# Patient Record
Sex: Male | Born: 1970 | Race: White | Hispanic: No | Marital: Married | Smoking: Former smoker
Health system: Southern US, Community
[De-identification: ages and names within clinical notes are randomized; demographics above are authoritative.]

## PROBLEM LIST (undated history)

## (undated) DIAGNOSIS — F32A Depression, unspecified: Secondary | ICD-10-CM

## (undated) DIAGNOSIS — I10 Essential (primary) hypertension: Secondary | ICD-10-CM

## (undated) DIAGNOSIS — I2510 Atherosclerotic heart disease of native coronary artery without angina pectoris: Secondary | ICD-10-CM

## (undated) DIAGNOSIS — F329 Major depressive disorder, single episode, unspecified: Secondary | ICD-10-CM

## (undated) DIAGNOSIS — F419 Anxiety disorder, unspecified: Secondary | ICD-10-CM

## (undated) DIAGNOSIS — E785 Hyperlipidemia, unspecified: Secondary | ICD-10-CM

## (undated) HISTORY — DX: Essential (primary) hypertension: I10

## (undated) HISTORY — DX: Depression, unspecified: F32.A

## (undated) HISTORY — DX: Major depressive disorder, single episode, unspecified: F32.9

## (undated) HISTORY — DX: Hyperlipidemia, unspecified: E78.5

## (undated) HISTORY — PX: TYMPANOSTOMY TUBE PLACEMENT: SHX32

## (undated) HISTORY — DX: Anxiety disorder, unspecified: F41.9

---

## 2002-07-03 ENCOUNTER — Ambulatory Visit (HOSPITAL_BASED_OUTPATIENT_CLINIC_OR_DEPARTMENT_OTHER): Admission: RE | Admit: 2002-07-03 | Discharge: 2002-07-03 | Payer: Self-pay | Admitting: Otolaryngology

## 2003-02-16 ENCOUNTER — Encounter: Payer: Self-pay | Admitting: Otolaryngology

## 2003-02-17 ENCOUNTER — Ambulatory Visit (HOSPITAL_COMMUNITY): Admission: RE | Admit: 2003-02-17 | Discharge: 2003-02-18 | Payer: Self-pay | Admitting: Otolaryngology

## 2004-05-26 HISTORY — PX: NASAL SEPTUM SURGERY: SHX37

## 2006-03-11 ENCOUNTER — Inpatient Hospital Stay (HOSPITAL_COMMUNITY): Admission: EM | Admit: 2006-03-11 | Discharge: 2006-03-14 | Payer: Self-pay | Admitting: Emergency Medicine

## 2006-03-12 ENCOUNTER — Ambulatory Visit: Payer: Self-pay | Admitting: Internal Medicine

## 2011-01-28 ENCOUNTER — Encounter (INDEPENDENT_AMBULATORY_CARE_PROVIDER_SITE_OTHER): Payer: Self-pay | Admitting: General Surgery

## 2011-01-28 ENCOUNTER — Ambulatory Visit (INDEPENDENT_AMBULATORY_CARE_PROVIDER_SITE_OTHER): Payer: BC Managed Care – PPO | Admitting: General Surgery

## 2011-01-28 VITALS — BP 146/104 | HR 88 | Temp 97.8°F | Ht 71.0 in | Wt 264.8 lb

## 2011-01-28 DIAGNOSIS — R220 Localized swelling, mass and lump, head: Secondary | ICD-10-CM

## 2011-01-28 DIAGNOSIS — R221 Localized swelling, mass and lump, neck: Secondary | ICD-10-CM

## 2011-01-28 NOTE — Progress Notes (Signed)
Subjective:     Patient ID: Jeffery Chapman, male   DOB: 28-Feb-1971, 40 y.o.   MRN: [id number]  HPI The patient is a 40 year old white male who has been having headaches for the last year. During that time he has noticed a knot on his posterior left neck at the base of the skull that seems to enlarge and then shrink it times. A lot of his head pain seems to be centered around this knot. It does not drain. He denies any fevers or chills  Review of Systems     Objective:   Physical Exam On exam he has a firm mobile palpable nodule in the left posterior upper neck. There is no overlying punctate hole to suggest a sebaceous cyst. It feels too firm to be a lipoma. With its change of size it is most likely a lymph node but the presentation seems a bit strange.   Assessment:     Mass in the upper left posterior neck    Plan:     Because the presentation seems a bit strange and the MRI did not show any abnormality in this area I would prefer to refer him to a ENT doctor. He has seen Dr. Annalee Genta in the past so we will refer him there.

## 2011-01-28 NOTE — Patient Instructions (Signed)
Refer to Dr. Annalee Genta in ENT

## 2011-09-14 ENCOUNTER — Ambulatory Visit (INDEPENDENT_AMBULATORY_CARE_PROVIDER_SITE_OTHER): Payer: BC Managed Care – PPO | Admitting: Family Medicine

## 2011-09-14 VITALS — BP 132/79 | HR 91 | Temp 98.8°F | Resp 16 | Ht 69.5 in | Wt 283.0 lb

## 2011-09-14 DIAGNOSIS — I10 Essential (primary) hypertension: Secondary | ICD-10-CM

## 2011-09-14 DIAGNOSIS — R51 Headache: Secondary | ICD-10-CM

## 2011-09-14 DIAGNOSIS — H9313 Tinnitus, bilateral: Secondary | ICD-10-CM

## 2011-09-14 DIAGNOSIS — H9319 Tinnitus, unspecified ear: Secondary | ICD-10-CM

## 2011-09-14 DIAGNOSIS — G8929 Other chronic pain: Secondary | ICD-10-CM

## 2011-09-14 MED ORDER — FLUTICASONE PROPIONATE 50 MCG/ACT NA SUSP: 2.0000 | Freq: Every day | NASAL | Status: DC

## 2011-09-14 MED ORDER — TIZANIDINE HCL 4 MG PO TABS: 4.0000 mg | ORAL_TABLET | Freq: Every day | ORAL | Status: DC

## 2011-09-14 MED ORDER — TOPIRAMATE 200 MG PO TABS: 200.0000 mg | ORAL_TABLET | Freq: Every day | ORAL | Status: DC

## 2011-09-14 MED ORDER — ALPRAZOLAM 0.5 MG PO TABS: 0.5000 mg | ORAL_TABLET | Freq: Every evening | ORAL | Status: DC | PRN

## 2011-09-14 MED ORDER — AMOXICILLIN 875 MG PO TABS: 875.0000 mg | ORAL_TABLET | Freq: Two times a day (BID) | ORAL | Status: DC

## 2011-09-14 MED ORDER — LISINOPRIL 5 MG PO TABS: 5.0000 mg | ORAL_TABLET | Freq: Every day | ORAL | Status: DC

## 2011-09-14 NOTE — Patient Instructions (Signed)
Tinnitus Sounds you hear in your ears and coming from within the ear is called tinnitus. This can be a symptom of many ear disorders. It is often associated with hearing loss.  Tinnitus can be seen with:  Infections.   Ear blockages such as wax buildup.   Meniere's disease.   Ear damage.   Inherited.   Occupational causes.  While irritating, it is not usually a threat to health. When the cause of the tinnitus is wax, infection in the middle ear, or foreign body it is easily treated. Hearing loss will usually be reversible.  TREATMENT  When treating the underlying cause does not get rid of tinnitus, it may be necessary to get rid of the unwanted sound by covering it up with more pleasant background noises. This may include music, the radio etc. There are tinnitus maskers which can be worn which produce background noise to cover up the tinnitus. Avoid all medications which tend to make tinnitus worse such as alcohol, caffeine, aspirin, and nicotine. There are many soothing background tapes such as rain, ocean, thunderstorms, etc. These soothing sounds help with sleeping or resting. Keep all follow-up appointments and referrals. This is important to identify the cause of the problem. It also helps avoid complications, impaired hearing, disability, or chronic pain. Document Released: 05/12/2005 Document Revised: 05/01/2011 Document Reviewed: 12/29/2007 [employer] Patient Information 2012 [employer]. 

## 2011-09-14 NOTE — Progress Notes (Signed)
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 It seemed to clear up on Tuesday but then came back to see night and since Wednesday he said high pitched ringing in both ears. he's had no ear pain or dizziness. 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  Patient has had bilateral myringotomy tubes in the past. In addition he's had chronic occipital headaches for which she seen neurologists, ureters and throat doctors, and most recently Dr. Renae Fickle who injected the occipital area in the past with good relief. Patient has been out of his medicines for blood pressure and headaches for a long time. He's not taken any Indocin or aspirin products either.  Objective alert and in no acute distress. Patient walks with an antalgic gait (he said he pulled a muscle in his right leg yesterday playing soccer with his kids)  HEENT: Bilateral myringotomy scars with mild retraction of TMs on both sides. His hearing is grossly normal.  Oropharynx clear  Neck: Supple no adenopathy  Neuro exam: Normal EOM and cranial nerve function, grossly normal motor function of both arms and legs.  Fundi: Normal  Chest: Clear  Heart: Regular no murmur or gallop  neck exam: No bruit  Assessment acute high-frequency tinnitus that is most consistent with serous otitis media. It is concerning that he has had chronic headaches in the past but as he reports the CT was negative and there are no other worrisome symptoms at this time.  Plan: Amoxicillin and Flonase,  I refilled his other medications and advised not to take aspirin since he's had problems with ever dependency in the past.  Past patient call me in 48 hours if symptoms are not clearing so that we can make a referral to Dr. Annalee Genta

## 2012-01-09 ENCOUNTER — Ambulatory Visit (INDEPENDENT_AMBULATORY_CARE_PROVIDER_SITE_OTHER): Payer: BC Managed Care – PPO | Admitting: Internal Medicine

## 2012-01-09 VITALS — BP 118/72 | HR 68 | Temp 98.1°F | Resp 14 | Ht 69.75 in | Wt 280.8 lb

## 2012-01-09 DIAGNOSIS — I2510 Atherosclerotic heart disease of native coronary artery without angina pectoris: Secondary | ICD-10-CM | POA: Insufficient documentation

## 2012-01-09 DIAGNOSIS — Z7189 Other specified counseling: Secondary | ICD-10-CM

## 2012-01-09 DIAGNOSIS — M109 Gout, unspecified: Secondary | ICD-10-CM | POA: Insufficient documentation

## 2012-01-09 NOTE — Progress Notes (Signed)
  Subjective:    Patient ID: Jeffery Chapman, male    DOB: 10/21/1970, 41 y.o.   MRN: [id number]  HPI Having gout attack left foot, usual sxs and place. Recent dx of CAD and had stent placed while in  [place]. Going to [facility] cardiology next week See scanned heart cath and stent done in [place]  Review of Systems obesity    Objective:   Physical Exam  Vitals reviewed. Constitutional: He is oriented to person, place, and time. He appears well-nourished. No distress.  Cardiovascular: Normal rate and regular rhythm.   Pulmonary/Chest: Effort normal.  Musculoskeletal: Normal range of motion. He exhibits tenderness.  Neurological: He is alert and oriented to person, place, and time.  Skin: There is erythema.  Psychiatric: He has a normal mood and affect.   Left foot warm, red, swollen a little.       Assessment & Plan:  Do not use nsaids daily Indocin ok for gout attacks 2-3 x per yr. Ok to drive

## 2012-01-14 ENCOUNTER — Encounter: Payer: Self-pay | Admitting: Cardiovascular Disease

## 2012-01-14 ENCOUNTER — Ambulatory Visit (INDEPENDENT_AMBULATORY_CARE_PROVIDER_SITE_OTHER): Payer: Self-pay | Admitting: Cardiovascular Disease

## 2012-01-14 VITALS — BP 115/74 | HR 67 | Ht 70.0 in | Wt 284.1 lb

## 2012-01-14 DIAGNOSIS — M109 Gout, unspecified: Secondary | ICD-10-CM

## 2012-01-14 DIAGNOSIS — E782 Mixed hyperlipidemia: Secondary | ICD-10-CM

## 2012-01-14 DIAGNOSIS — I2510 Atherosclerotic heart disease of native coronary artery without angina pectoris: Secondary | ICD-10-CM

## 2012-01-14 MED ORDER — ATORVASTATIN CALCIUM 80 MG PO TABS: 80.0000 mg | ORAL_TABLET | Freq: Every day | ORAL | Status: DC

## 2012-01-14 MED ORDER — BISOPROLOL FUMARATE 5 MG PO TABS: ORAL_TABLET | ORAL | Status: DC

## 2012-01-14 MED ORDER — NITROGLYCERIN 0.4 MG/SPRAY TL SOLN: 1.0000 | Status: DC | PRN

## 2012-01-14 MED ORDER — CLOPIDOGREL BISULFATE 75 MG PO TABS: 75.0000 mg | ORAL_TABLET | Freq: Every day | ORAL | Status: DC

## 2012-01-14 NOTE — Assessment & Plan Note (Signed)
Continue high dose statin  Labs in 8 weeks

## 2012-01-14 NOTE — Assessment & Plan Note (Signed)
Recent stent to circumflex.  Dual antiplatelet Rx  Check P2Y  ASA

## 2012-01-14 NOTE — Assessment & Plan Note (Signed)
Try to avoid NSAI's Check uric acid with labs for cholesterol in 8 weeks.  Colchicine for acute attacks  Usually involves right great toe

## 2012-01-14 NOTE — Patient Instructions (Addendum)
Your physician recommends that you schedule a follow-up appointment in: 8 WEEKS WITH  DR [facility] Your physician recommends that you continue on your current medications as directed. Please refer to the Current Medication list given to you today. Your physician recommends that you return for lab work in:  P2Y DX V58.69

## 2012-01-14 NOTE — Progress Notes (Signed)
Patient ID: Jeffery Chapman, male   DOB: 1971-02-11, 41 y.o.   MRN: [id number] 41 yo Rx for circumflex MI in [place] 01/02/12  :Presented with chest pain and Troponin 9.  No acute ST elevation.  Had biodegradable stent to totally occluded OM  Single vessel disease No mention of EF by records.  CRF;s HTN and elevated lipids  Anginal pain appears to be left arm and jaw.  Has had chronic central chest pain for years thought due to anxiety on xanax for years.  Since coming home following low carb diet and walking on treadmill at [facility].  No pains.  Taking meds but needs scripts  ROS: Denies fever, malais, weight loss, blurry vision, decreased visual acuity, cough, sputum, SOB, hemoptysis, pleuritic pain, palpitaitons, heartburn, abdominal pain, melena, lower extremity edema, claudication, or rash.  All other systems reviewed and negative   General: Affect appropriate Healthy:  appears stated age HEENT: normal Neck supple with no adenopathy JVP normal no bruits no thyromegaly Lungs clear with no wheezing and good diaphragmatic motion Heart:  S1/S2 no murmur,rub, gallop or click PMI normal Abdomen: benighn, BS positve, no tenderness, no AAA no bruit.  No HSM or HJR Distal pulses intact with no bruits No edema Neuro non-focal Skin warm and dry No muscular weakness  Medications Current Outpatient Prescriptions  Medication Sig Dispense Refill  . ALPRAZolam (XANAX) 0.5 MG tablet Take 1 tablet (0.5 mg total) by mouth at bedtime as needed.  90 tablet  2  . fluticasone (FLONASE) 50 MCG/ACT nasal spray Place 2 sprays into the nose daily.  1 g  0  . indomethacin (INDOCIN) 50 MG capsule Ad lib.      Marland Kitchen lisinopril (PRINIVIL,ZESTRIL) 5 MG tablet Take 1 tablet (5 mg total) by mouth daily.  90 tablet  3  . tiZANidine (ZANAFLEX) 4 MG tablet Take 1 tablet (4 mg total) by mouth daily.  90 tablet  3  . topiramate (TOPAMAX) 200 MG tablet Take 1 tablet (200 mg total) by mouth daily.  90 tablet  2     Allergies Hctz  Family History: Family History  Problem Relation Age of Onset  . Alcohol abuse Father   . Cancer Paternal Aunt     breast  . Cancer Paternal Grandmother     breast    Social History: History   Social History  . Marital Status: Married    Spouse Name: N/A    Number of Children: N/A  . Years of Education: N/A   Occupational History  . Not on file.   Social History Main Topics  . Smoking status: Former Games developer  . Smokeless tobacco: Not on file   Comment: quit- 2000  . Alcohol Use: Yes  . Drug Use: No  . Sexually Active:    Other Topics Concern  . Not on file   Social History Narrative  . No narrative on file    Electrocardiogram:  NSR rate 61  T wave inversion lead 3 nonspecific ST/T wave change  Assessment and Plan

## 2012-01-15 ENCOUNTER — Ambulatory Visit (HOSPITAL_COMMUNITY)
Admission: RE | Admit: 2012-01-15 | Discharge: 2012-01-15 | Disposition: A | Discharge status: Home / Self Care | Payer: BC Managed Care – PPO | Source: Ambulatory Visit | Attending: Cardiovascular Disease | Admitting: Cardiovascular Disease

## 2012-01-15 DIAGNOSIS — Z79899 Other long term (current) drug therapy: Secondary | ICD-10-CM | POA: Insufficient documentation

## 2012-01-19 ENCOUNTER — Encounter (HOSPITAL_COMMUNITY): Payer: Self-pay | Admitting: Emergency Medicine

## 2012-01-19 ENCOUNTER — Other Ambulatory Visit: Payer: Self-pay | Admitting: *Deleted

## 2012-01-19 ENCOUNTER — Ambulatory Visit (INDEPENDENT_AMBULATORY_CARE_PROVIDER_SITE_OTHER): Payer: BC Managed Care – PPO | Admitting: Emergency Medicine

## 2012-01-19 ENCOUNTER — Emergency Department (HOSPITAL_COMMUNITY): Payer: BC Managed Care – PPO

## 2012-01-19 ENCOUNTER — Observation Stay (HOSPITAL_COMMUNITY)
Admission: EM | Admit: 2012-01-19 | Discharge: 2012-01-20 | DRG: 143 | Disposition: A | Discharge status: Home / Self Care | Payer: BC Managed Care – PPO | Attending: Cardiology | Admitting: Cardiology

## 2012-01-19 VITALS — BP 115/72 | HR 75 | Temp 98.0°F | Resp 18 | Ht 69.75 in | Wt 285.0 lb

## 2012-01-19 DIAGNOSIS — F329 Major depressive disorder, single episode, unspecified: Secondary | ICD-10-CM | POA: Diagnosis present

## 2012-01-19 DIAGNOSIS — E669 Obesity, unspecified: Secondary | ICD-10-CM

## 2012-01-19 DIAGNOSIS — Z79899 Other long term (current) drug therapy: Secondary | ICD-10-CM

## 2012-01-19 DIAGNOSIS — F419 Anxiety disorder, unspecified: Secondary | ICD-10-CM

## 2012-01-19 DIAGNOSIS — I2510 Atherosclerotic heart disease of native coronary artery without angina pectoris: Secondary | ICD-10-CM | POA: Diagnosis present

## 2012-01-19 DIAGNOSIS — F411 Generalized anxiety disorder: Secondary | ICD-10-CM | POA: Diagnosis present

## 2012-01-19 DIAGNOSIS — I200 Unstable angina: Secondary | ICD-10-CM

## 2012-01-19 DIAGNOSIS — R599 Enlarged lymph nodes, unspecified: Secondary | ICD-10-CM | POA: Diagnosis present

## 2012-01-19 DIAGNOSIS — R079 Chest pain, unspecified: Secondary | ICD-10-CM

## 2012-01-19 DIAGNOSIS — M109 Gout, unspecified: Secondary | ICD-10-CM | POA: Diagnosis present

## 2012-01-19 DIAGNOSIS — E782 Mixed hyperlipidemia: Secondary | ICD-10-CM | POA: Diagnosis present

## 2012-01-19 DIAGNOSIS — I10 Essential (primary) hypertension: Secondary | ICD-10-CM | POA: Diagnosis present

## 2012-01-19 DIAGNOSIS — Z87891 Personal history of nicotine dependence: Secondary | ICD-10-CM

## 2012-01-19 DIAGNOSIS — I498 Other specified cardiac arrhythmias: Secondary | ICD-10-CM | POA: Diagnosis present

## 2012-01-19 DIAGNOSIS — I252 Old myocardial infarction: Secondary | ICD-10-CM

## 2012-01-19 DIAGNOSIS — R0789 Other chest pain: Principal | ICD-10-CM | POA: Diagnosis present

## 2012-01-19 DIAGNOSIS — Z9861 Coronary angioplasty status: Secondary | ICD-10-CM

## 2012-01-19 DIAGNOSIS — F3289 Other specified depressive episodes: Secondary | ICD-10-CM | POA: Diagnosis present

## 2012-01-19 DIAGNOSIS — Z7982 Long term (current) use of aspirin: Secondary | ICD-10-CM

## 2012-01-19 HISTORY — DX: Atherosclerotic heart disease of native coronary artery without angina pectoris: I25.10

## 2012-01-19 LAB — CREATININE, SERUM
Creatinine, Ser: 0.98 mg/dL (ref 0.50–1.35)
GFR calc non Af Amer: >90 mL/min (ref >90)

## 2012-01-19 LAB — CBC WITH DIFFERENTIAL/PLATELET
Basophils Absolute: 0 10*3/uL (ref 0.0–0.1)
Eosinophils Absolute: 0.2 10*3/uL (ref 0.0–0.7)
Eosinophils Relative: 3 % (ref 0–5)
HCT: 35.4 % — ABNORMAL LOW (ref 39.0–52.0)
Lymphocytes Relative: 33 % (ref 12–46)
MCH: 30.2 pg (ref 26.0–34.0)
MCHC: 34.2 g/dL (ref 30.0–36.0)
MCV: 88.3 fL (ref 78.0–100.0)
Monocytes Absolute: 0.6 10*3/uL (ref 0.1–1.0)
RDW: 13.3 % (ref 11.5–15.5)
WBC: 5.9 10*3/uL (ref 4.0–10.5)

## 2012-01-19 LAB — CARDIAC PANEL(CRET KIN+CKTOT+MB+TROPI)
CK, MB: 1.9 ng/mL (ref 0.3–4.0)
Relative Index: 1.5 (ref 0.0–2.5)
Total CK: 128 U/L (ref 7–232)

## 2012-01-19 LAB — BASIC METABOLIC PANEL
CO2: 22 mEq/L (ref 19–32)
Calcium: 9.6 mg/dL (ref 8.4–10.5)
Creatinine, Ser: 0.93 mg/dL (ref 0.50–1.35)
GFR calc non Af Amer: >90 mL/min (ref >90)

## 2012-01-19 LAB — CBC
HCT: 36.8 % — ABNORMAL LOW (ref 39.0–52.0)
MCHC: 34 g/dL (ref 30.0–36.0)
RDW: 13.3 % (ref 11.5–15.5)

## 2012-01-19 LAB — TROPONIN I: Troponin I: <0.3 ng/mL (ref <0.30)

## 2012-01-19 MED ORDER — ATORVASTATIN CALCIUM 80 MG PO TABS
80.0000 mg | ORAL_TABLET | Freq: Every evening | ORAL | Status: DC
  Administered 2012-01-19: 80 mg via ORAL
  Filled 2012-01-19 (×2): qty 1

## 2012-01-19 MED ORDER — ACETAMINOPHEN 325 MG PO TABS
650.0000 mg | ORAL_TABLET | Freq: Once | ORAL | Status: AC
  Administered 2012-01-19: 650 mg via ORAL
  Filled 2012-01-19: qty 2

## 2012-01-19 MED ORDER — ONDANSETRON HCL 4 MG/2ML IJ SOLN: 4.0000 mg | Freq: Once | INTRAMUSCULAR | Status: DC

## 2012-01-19 MED ORDER — ALPRAZOLAM 0.5 MG PO TABS: 0.5000 mg | ORAL_TABLET | Freq: Every evening | ORAL | Status: DC | PRN

## 2012-01-19 MED ORDER — PRASUGREL HCL 10 MG PO TABS
10.0000 mg | ORAL_TABLET | Freq: Every day | ORAL | Status: DC
  Administered 2012-01-20: 10 mg via ORAL
  Filled 2012-01-19: qty 1

## 2012-01-19 MED ORDER — ZOLPIDEM TARTRATE 5 MG PO TABS: 5.0000 mg | ORAL_TABLET | Freq: Every evening | ORAL | Status: DC | PRN

## 2012-01-19 MED ORDER — ENOXAPARIN SODIUM 40 MG/0.4ML ~~LOC~~ SOLN
40.0000 mg | SUBCUTANEOUS | Status: DC
  Administered 2012-01-19: 40 mg via SUBCUTANEOUS
  Filled 2012-01-19 (×2): qty 0.4

## 2012-01-19 MED ORDER — ACETAMINOPHEN 325 MG PO TABS: 650.0000 mg | ORAL_TABLET | ORAL | Status: DC | PRN

## 2012-01-19 MED ORDER — ASPIRIN 81 MG PO CHEW
81.0000 mg | CHEWABLE_TABLET | Freq: Every day | ORAL | Status: DC
  Administered 2012-01-20: 81 mg via ORAL
  Filled 2012-01-19: qty 1

## 2012-01-19 MED ORDER — MORPHINE SULFATE 4 MG/ML IJ SOLN: 4.0000 mg | Freq: Once | INTRAMUSCULAR | Status: DC

## 2012-01-19 MED ORDER — ONDANSETRON HCL 4 MG/2ML IJ SOLN: 4.0000 mg | Freq: Four times a day (QID) | INTRAMUSCULAR | Status: DC | PRN

## 2012-01-19 MED ORDER — NITROGLYCERIN 0.4 MG SL SUBL: 0.4000 mg | SUBLINGUAL_TABLET | SUBLINGUAL | Status: DC | PRN

## 2012-01-19 MED ORDER — PRASUGREL HCL 10 MG PO TABS: 10.0000 mg | ORAL_TABLET | Freq: Every day | ORAL | Status: DC

## 2012-01-19 MED ORDER — LISINOPRIL 5 MG PO TABS
5.0000 mg | ORAL_TABLET | Freq: Every evening | ORAL | Status: DC
  Administered 2012-01-19: 5 mg via ORAL
  Filled 2012-01-19 (×2): qty 1

## 2012-01-19 MED ORDER — BISOPROLOL FUMARATE 5 MG PO TABS
5.0000 mg | ORAL_TABLET | Freq: Every morning | ORAL | Status: DC
  Administered 2012-01-20: 5 mg via ORAL
  Filled 2012-01-19: qty 1

## 2012-01-19 NOTE — ED Notes (Addendum)
Pt returned from X-ray and placed back on monitors.

## 2012-01-19 NOTE — ED Notes (Signed)
MD at pt bedside

## 2012-01-19 NOTE — H&P (Signed)
History and Physical   Patient ID: Jeffery Chapman MRN: [id number], DOB/AGE: 09-22-1970   Admit date: 01/19/2012 Date of Consult: 01/19/2012   Primary Physician: Elvina Sidle, MD Primary Cardiologist: Charlton Haws, MD  HPI: Jeffery Chapman is a 41yo male with PMHx significant for CAD (NSTEMI s/p biodegradable stenting to LCx in [place] 01/02/12), HTN, HL, obesity, anxiety and gout who presents to [facility] ED with chest pain.  He had recently followed up with Dr. Eden Emms on 01/14/12 for medication refills. Prior records were reviewed. He was noted to have single vessel CAD. There was no mention of EF. Anginal pain noted to be primarily located in left arm and jaw. He endorsed chronic chest pain for years which he attributed to anxiety. He states since [place], he has been following a low carb diet and walking for exercise. A P2Y12 assay was ordered and returned at 197. Home meds as below include ASA, Effient, BB, ACEi, Lipitor 80, NTG SL PRN.   The patient reports baseline anxiety characterized as intermittent, bandlike chest discomfort occurring for about 8 years. He had undergone previous stress testing when this sensation first begun which was normal.   He states that he had been traveling extensively prior to undergoing PCI earlier this month. He states he does not typically get much sleep due to his anxiety. He had recently returned from a fishing trip in [place] and noticed a nagging left shoulder discomfort with radiation to the inside of his left arm and elbow. He also notes radiation to his jaw and associated diaphoresis. These episodes were intermittent and not associated with exertion. He would take NSAIDs, Xanax and warm showers with alleviation. He attributed this to a musculoskeletal cause. These episodes worsened while in [place]. He was worked up in an ED there and trop returned >9.0. He underwent biodegradable stenting to his LCx distal to the OM1. There was residual  nonobstructive LAD disease.   He had been doing well since that time. He has been adhering to a low carb diet and walking several miles a day on a treadmill. During activity, he will notice short (1-2 sec) episodes of sharp chest and arm pain similar in quality but less intense than just before his cath. On Saturday, he notes that he "just did not feel right." He describes this as paranoia, however has limited his activity level. He does note occasional intermittent 1-2 sec episodes of sharp chest and left pain pain at rest during this time. He denies shortness of breath, lightheadedness. N/v, diaphoresis, LE edema, orhopnea, PND or new cough. He denies fevers or chills. He denies active bleeding. He quit smoking tobacco 15 years ago. He quit chewing tobacco 3 weeks ago. He notes occasional alcohol use. Denies elicit drug use. These symptoms persisted and he presented to th ED.   Upon ED arrival, EKG reveals no evidence of ischemia. Initial trop-I WNL. CBC reveals a mild normocytic anemia (H/H at 12.1/35.4). BMET unremarkable. CXR reveals cardiomegaly without congestive failure.   Problem List: Past Medical History  Diagnosis Date  . Hyperlipidemia   . Hypertension   . Anxiety   . Depression   . Gout   . CAD (coronary artery disease)     12/2011- PCI in [place]; s/p nondegradable stent to LAD; nonobstructive residual LAD disease    Past Surgical History  Procedure Date  . Nasal septum surgery 2006  . Tympanostomy tube placement 1976, 1981     Allergies:  Allergies  Allergen  Reactions  . Hctz (Hydrochlorothiazide)     aggravates gout    Home Medications: Prior to Admission medications   Medication Sig Start Date End Date Taking? Authorizing Provider  ALPRAZolam Prudy Feeler) 0.5 MG tablet Take 0.5-0.75 mg by mouth at bedtime as needed. 09/14/11  Yes Elvina Sidle, MD  aspirin 81 MG chewable tablet Chew 81 mg by mouth daily.   Yes Historical Provider, MD  atorvastatin (LIPITOR) 80 MG  tablet Take 80 mg by mouth every evening. 01/14/12 01/13/13 Yes Wendall Stade, MD  bisoprolol (ZEBETA) 5 MG tablet Take 5 mg by mouth every morning.   Yes Historical Provider, MD  cholecalciferol (VITAMIN D) 1000 UNITS tablet Take 1,000 Units by mouth daily.   Yes Historical Provider, MD  Coenzyme Q10 (CO Q10) 100 MG TABS Take 100 mg by mouth daily.   Yes Historical Provider, MD  indomethacin (INDOCIN) 50 MG capsule Take 100 mg by mouth 3 (three) times daily as needed. For gout 11/12/10  Yes Historical Provider, MD  lisinopril (PRINIVIL,ZESTRIL) 5 MG tablet Take 5 mg by mouth every evening.   Yes Historical Provider, MD  Magnesium 500 MG CAPS Take 500 mg by mouth daily.   Yes Historical Provider, MD  nitroGLYCERIN (NITROLINGUAL) 0.4 MG/SPRAY spray Place 1 spray under the tongue every 5 (five) minutes as needed for chest pain. 01/14/12 01/13/13 Yes Wendall Stade, MD  Omega-3 Fatty Acids (FISH OIL PO) Take 1 capsule by mouth daily.   Yes Historical Provider, MD  prasugrel (EFFIENT) 10 MG TABS Take 1 tablet (10 mg total) by mouth daily. 01/19/12   Wendall Stade, MD    Inpatient Medications:     . acetaminophen  650 mg Oral Once  . DISCONTD:  morphine injection  4 mg Intravenous Once  . DISCONTD: ondansetron  4 mg Intravenous Once    (Not in a hospital admission)  Family History  Problem Relation Age of Onset  . Alcohol abuse Father   . Cancer Paternal Aunt     breast  . Cancer Paternal Grandmother     breast     History   Social History  . Marital Status: Married    Spouse Name: N/A    Number of Children: N/A  . Years of Education: N/A   Occupational History  . Contractor    Social History Main Topics  . Smoking status: Former Games developer  . Smokeless tobacco: Not on file   Comment: quit- 2000  . Alcohol Use: Yes  . Drug Use: No  . Sexually Active:    Other Topics Concern  . Not on file   Social History Narrative  . No narrative on file     Review of Systems: General:  positive for swollen lymph nodes, negative for chills, fever, night sweats or weight changes.  Cardiovascular: negative for chest pain, dyspnea on exertion, edema, orthopnea, palpitations, paroxysmal nocturnal dyspnea or shortness of breath Dermatological: negative for rash Respiratory: negative for cough or wheezing Urologic: negative for hematuria Abdominal: negative for nausea, vomiting, diarrhea, bright red blood per rectum, melena, or hematemesis Neurologic:  negative for visual changes, syncope, or dizziness Psych: positive for anxiety All other systems reviewed and are otherwise negative except as noted above.  Physical Exam: Blood pressure 132/64, pulse 59, temperature 97.8 F (36.6 C), temperature source Oral, resp. rate 18, SpO2 98.00%.    General: Obese, well developed, in no acute distress. Head: Normocephalic, atraumatic, sclera non-icteric, no xanthomas, nares are without discharge.  Neck: Negative  for carotid bruits. JVD not elevated. Mild tenderness and lymphadenopathy to R submental, submandibular LNs. Lungs: Clear bilaterally to auscultation without wheezes, rales, or rhonchi. Breathing is unlabored. Heart: RRR with S1 S2. No murmurs, rubs, or gallops appreciated. Abdomen: Soft, non-tender, non-distended with normoactive bowel sounds. No hepatomegaly. No rebound/guarding. No obvious abdominal masses. Msk:  Strength and tone appears normal for age. Extremities: No clubbing, cyanosis or edema.  Distal pedal pulses are 2+ and equal bilaterally. Neuro: Alert and oriented X 3. Moves all extremities spontaneously. Psych:  Responds to questions appropriately with a normal affect.  Labs: Recent Labs  Basename 01/19/12 1222   WBC 5.9   HGB 12.1*   HCT 35.4*   MCV 88.3   PLT 243   Lab 01/19/12 1222  NA 141  K 4.2  CL 108  CO2 22  BUN 16  CREATININE 0.93  CALCIUM 9.6  PROT --  BILITOT --  ALKPHOS --  ALT --  AST --  AMYLASE --  LIPASE --  GLUCOSE 92   Recent  Labs  Basename 01/19/12 1222   CKTOTAL --   CKMB --   CKMBINDEX --   TROPONINI <0.30   Radiology/Studies: Dg Chest 2 View  01/19/2012  *RADIOLOGY REPORT*  Clinical Data: Chest pain/pressure.  CHEST - 2 VIEW  Comparison: None.  Findings: Lateral view degraded by patient arm position.  Mild mid thoracic spondylosis. Midline trachea.  Moderate cardiomegaly. Mediastinal contours otherwise within normal limits. No pleural effusion or pneumothorax.  No congestive failure.  Clear lungs.  IMPRESSION: Cardiomegaly without congestive failure.   Original Report Authenticated By: Consuello Bossier, M.D.     EKG: NSR, 60 bpm, LAD, no ST-T wave changes  ASSESSMENT:   1. Atypical chest pain 2. CAD 3. HTN 4. Hyperlipidemia 5. History of tobacco abuse 6. Anxiety 7. Gout 8. Lymphadenopathy  DISCUSSION/PLAN:  The patient reports generally not feeling right. He denies experiencing worsening symptoms which appear to be his anginal equivalent- jaw and left arm pain. He does state that he has anxiety at baseline which is debilitating at times affecting his energy level. He does some increase to his anxiety after undergoing PCI earlier this month. He has made a considerable effort to mitigate his risk factors including dietary modification and daily aerobic exercise. He notes no exertional ischemic symptoms with the latter. EKG and initial trop-I reveal no evidence of ischemia. CXR reveals no acute process. VSS. His symptoms are fairly atypical for ACS, however given his cardiac history with recent intervention and cardiac risk factors, will admit to telemetry and rule out. Continue outpatient medications, particularly ASA/Effient/BB/ACEi/statin/NTG. Will continue home Xanax. Hold NSAID for gout. There is no evidence of acute gouty attack. Lymphadenopathy may be representative of a viral URI (afebrile without leukocytosis, no evidence of exudate on exam, no cough). Should he rule out, will discharge and have him  follow-up with Dr. Eden Emms for continued risk factor reduction and evaluation of the patient's symptoms. Anxiety may continue to cloud clear anginal symptoms, and have encouraged PCP follow-up for management of this.    Signed, R. Hurman Horn, PA-C 01/19/2012, 3:21 PM   As above, patient seen and examined. Briefly 41 year old male with past medical history of coronary disease, hypertension, hyperlipidemia, anxiety with chest pain. Patient is status post PCI of his left circumflex following a non-ST elevation myocardial infarction in [place] on 01/02/2012. At that time cardiac catheterization revealed no obstructive disease in his LAD or right coronary artery. The patient's presenting symptoms  at that time were pain in his left upper arm and shoulder. The patient has had intermittent bandlike pain in the substernal area for 8 years that is relieved with Xanax. It is not exertional and it is unlike his pain at the time of his infarct. Over the past several days he notices increased weakness and fatigue. He also had sharp pain in his left chest for one to 2 seconds while on his treadmill. He also has had some of the bandlike pain described above. He has had no pain like his infarct pain. He presented to the emergency room and is presently pain-free. Cardiology asked to evaluate. He denies dyspnea on exertion, orthopnea, PND, pedal edema or exertional chest pain. Electrocardiogram shows sinus rhythm with no ST changes. Initial enzymes negative. Chest pain is very atypical. Will rule out myocardial infarction with serial enzymes. Continue preadmission medications including aspirin, effient, beta blocker, ACE inhibitor and statin. If enzymes negative DC tomorrow morning and follow up with Dr. Eden Emms. Olga Millers  3:21 PM

## 2012-01-19 NOTE — Progress Notes (Signed)
Subjective:    Patient ID: Jeffery Chapman, male    DOB: 17-Mar-1971, 41 y.o.   MRN: [id number]  HPI Comments: Travelled to [place] for three weeks and while there had chest pain and was stented in the circumflex artery.  Over the weekend had two episodes of prolonged chest pain hours long while active. Forced to sit down and rest intermittently.  Diaphoretic and light headed with radiation into the left arm.  Presumed it was anxiety.  Today had another episode of chest pain that was associated with a treadmill exercise.  Again had pain in left arm and light headedness.  Took his only dose of NTG.  Still has a "warm sensation" in his substernal area.  Chest Pain  This is a new problem. The current episode started in the past 7 days. The onset quality is sudden. The problem occurs intermittently. The problem has been unchanged. The pain is present in the substernal region. The pain is moderate. The quality of the pain is described as heavy and pressure. The pain radiates to the left arm. Associated symptoms include diaphoresis, dizziness, exertional chest pressure, malaise/fatigue, palpitations and shortness of breath. Pertinent negatives include no abdominal pain, back pain, claudication, cough, fever, headaches, hemoptysis, irregular heartbeat, leg pain, lower extremity edema, nausea, near-syncope, numbness, orthopnea, PND, sputum production, syncope or vomiting. The pain is aggravated by exertion. He has tried nothing for the symptoms. Risk factors include lack of exercise, male gender, obesity and smoking/tobacco exposure.  His past medical history is significant for anxiety/panic attacks, hyperlipidemia and hypertension.  Pertinent negatives for past medical history include no aneurysm, no aortic aneurysm, no aortic dissection, no arrhythmia, no bicuspid aortic valve, no CAD, no cancer, no congenital heart disease, no connective tissue disease, no COPD, no CHF, no diabetes, no DVT, no hyperhomocysteinemia,  no Kawasaki disease, no Marfan's syndrome, no MI, no mitral valve prolapse, no pacemaker, no PE, no PVD, no recent injury, no rheumatic fever, no seizures, no sickle cell disease, no sleep apnea, no spontaneous pneumothorax, no stimulant use, no strokes, no thyroid problem, no TIA, Turner syndrome and no valve disorder.  His family medical history is significant for heart disease in family, hyperlipidemia in family and hypertension in family.  Pertinent negatives for family medical history include: family history of aortic dissection, no CAD in family, no connective tissue disease in family, no early MI in family, no PE in family, no PVD in family, no sickle cell disease in family, no stroke in family, no sudden death in family and no TIA in family. Prior diagnostic workup includes cardiac catherization and chest x-ray.      Review of Systems  Constitutional: Positive for malaise/fatigue and diaphoresis. Negative for fever.  HENT: Negative.   Eyes: Negative.   Respiratory: Positive for chest tightness and shortness of breath. Negative for cough, hemoptysis and sputum production.   Cardiovascular: Positive for chest pain and palpitations. Negative for orthopnea, claudication, leg swelling, syncope, PND and near-syncope.  Gastrointestinal: Negative for nausea, vomiting and abdominal pain.  Musculoskeletal: Negative for back pain.  Neurological: Positive for dizziness. Negative for seizures, numbness and headaches.       Objective:   Physical Exam  Constitutional: He is oriented to person, place, and time. He appears well-developed and well-nourished.  HENT:  Head: Normocephalic and atraumatic.  Right Ear: External ear normal.  Left Ear: External ear normal.  Eyes: Conjunctivae are normal. Pupils are equal, round, and reactive to light. Right eye exhibits no discharge. Left  eye exhibits no discharge.  Neck: Normal range of motion. Neck supple.  Cardiovascular: Normal rate, regular rhythm and  normal heart sounds.   Pulmonary/Chest: Effort normal and breath sounds normal. No respiratory distress.  Abdominal: Soft. Bowel sounds are normal.  Musculoskeletal: Normal range of motion.  Neurological: He is alert and oriented to person, place, and time.  Skin: Skin is warm and dry.          Assessment & Plan:  Unstable angina EKG within normal limits and no changes from 8/21 To ER

## 2012-01-19 NOTE — ED Notes (Signed)
Pt states has been feeling good up until this past Saturday. Pt states feeling an intense overwhelming feeling of flight or fight response and states "just doesn't feel right." Pt states having an uncomfortable feeling. Denies N/V, diaphoresis and SOB. Pt c/o slight dizziness and having a headache from taking nitro this morning and on EMS. Pt currently denies chest pain and pressure.

## 2012-01-19 NOTE — ED Provider Notes (Signed)
History     CSN: [id number]  Arrival date & time 01/19/12  1123   First MD Initiated Contact with Patient 01/19/12 1143      Chief Complaint  Patient presents with  . Chest Pain  . Anxiety    (Consider location/radiation/quality/duration/timing/severity/associated sxs/prior treatment) HPI  Pt to the ER by EMS with symptoms of not feeling well.  Pt recently had occlusion on Cardiac Cath for circumflex MI in [place] 01/02/12 :Presented with chest pain and Troponin 9. No acute ST elevation. 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 CRF;s HTN and elevated lipids Anginal pain appears to be left arm and jaw. Has had chronic central chest pain for years thought due to anxiety on xanax for years. Since coming home following low carb diet and walking on treadmill at [facility]. No pains. Taking meds but needs scripts  These past couple of days patient admits that he has been feeling "weird" and off. He admits that he left church early on Sunday as well as did not take his kids to school or work out for as long as normal things morning. He denies SOB, diaphoresis. He denies having chest pain, but admits his heart feels like hot water is being poured on it.      Past Medical History  Diagnosis Date  . Hyperlipidemia   . Hypertension   . Anxiety   . Depression   . Gout     Past Surgical History  Procedure Date  . Nasal septum surgery 2006  . Tympanostomy tube placement 1976, 1981    Family History  Problem Relation Age of Onset  . Alcohol abuse Father   . Cancer Paternal Aunt     breast  . Cancer Paternal Grandmother     breast    History  Substance Use Topics  . Smoking status: Former Games developer  . Smokeless tobacco: Not on file   Comment: quit- 2000  . Alcohol Use: Yes      Review of Systems   HEENT: denies blurry vision or change in hearing PULMONARY: Denies difficulty breathing and SOB CARDIAC: denies chest  pain or heart palpitations MUSCULOSKELETAL:  denies being unable to ambulate ABDOMEN AL: denies abdominal pain GU: denies loss of bowel or urinary control NEURO: denies numbness and tingling in extremities SKIN: no new rashes PSYCH: patient denies anxiety or depression. NECK: Pt denies having neck pain     Allergies  Hctz  Home Medications   Current Outpatient Rx  Name Route Sig Dispense Refill  . ALPRAZOLAM 0.5 MG PO TABS Oral Take 0.5-0.75 mg by mouth at bedtime as needed.    . ASPIRIN 81 MG PO CHEW Oral Chew 81 mg by mouth daily.    . ATORVASTATIN CALCIUM 80 MG PO TABS Oral Take 80 mg by mouth every evening.    Marland Kitchen BISOPROLOL FUMARATE 5 MG PO TABS Oral Take 5 mg by mouth every morning.    Marland Kitchen VITAMIN D 1000 UNITS PO TABS Oral Take 1,000 Units by mouth daily.    . CO Q10 100 MG PO TABS Oral Take 100 mg by mouth daily.    . INDOMETHACIN 50 MG PO CAPS Oral Take 100 mg by mouth 3 (three) times daily as needed. For gout    . LISINOPRIL 5 MG PO TABS Oral Take 5 mg by mouth every evening.    Marland Kitchen MAGNESIUM 500 MG PO CAPS Oral Take 500 mg by mouth daily.    Marland Kitchen  NITROGLYCERIN 0.4 MG/SPRAY TL SOLN Sublingual Place 1 spray under the tongue every 5 (five) minutes as needed for chest pain. 12 g 1  . FISH OIL PO Oral Take 1 capsule by mouth daily.    Marland Kitchen PRASUGREL HCL 10 MG PO TABS Oral Take 1 tablet (10 mg total) by mouth daily. 30 tablet 6    BP 132/64  Pulse 59  Temp 97.8 F (36.6 C) (Oral)  Resp 18  SpO2 98%  Physical Exam  Nursing note and vitals reviewed. Constitutional: He appears well-developed and well-nourished. No distress.  HENT:  Head: Normocephalic and atraumatic.  Eyes: Pupils are equal, round, and reactive to light.  Neck: Normal range of motion. Neck supple.  Cardiovascular: Normal rate and regular rhythm.   Pulmonary/Chest: Effort normal.  Abdominal: Soft.  Neurological: He is alert.  Skin: Skin is warm and dry.    ED Course  Procedures (including critical care  time)  Labs Reviewed  CBC WITH DIFFERENTIAL - Abnormal; Notable for the following:    RBC 4.01 (*)     Hemoglobin 12.1 (*)     HCT 35.4 (*)     All other components within normal limits  BASIC METABOLIC PANEL  TROPONIN I   Dg Chest 2 View  01/19/2012  *RADIOLOGY REPORT*  Clinical Data: Chest pain/pressure.  CHEST - 2 VIEW  Comparison: None.  Findings: Lateral view degraded by patient arm position.  Mild mid thoracic spondylosis. Midline trachea.  Moderate cardiomegaly. Mediastinal contours otherwise within normal limits. No pleural effusion or pneumothorax.  No congestive failure.  Clear lungs.  IMPRESSION: Cardiomegaly without congestive failure.   Original Report Authenticated By: Consuello Bossier, M.D.      1. Chest pain       MDM   Date: 01/19/2012  Rate: 60  Rhythm: normal sinus rhythm  QRS Axis: indeterminate  Intervals: normal  ST/T Wave abnormalities: normal  Conduction Disutrbances:none  Narrative Interpretation:   Old EKG Reviewed: unchanged from Sept 23, 2014    Per cath report that has been printed out, patient has some minor cardiac disease on Cath report that did not require intervention. Patient is concerned as to if his symptoms are being caused by his heart, an illness or anxiety.  I will consult cardiology.  [facility] has agreed to come see patient.for disposition    Dorthula Matas, PA 01/19/12 1520

## 2012-01-19 NOTE — ED Notes (Addendum)
Neva Seat, PA at pt bedside.

## 2012-01-19 NOTE — ED Provider Notes (Signed)
Medical screening examination/treatment/procedure(s) were performed by non-physician practitioner and as supervising physician I was immediately available for consultation/collaboration.   Carissa Musick L Phong Isenberg, MD 01/19/12 1529 

## 2012-01-19 NOTE — ED Notes (Signed)
Per EMS: Pt has had intermittent chest pressure starting Saturday. Pt states taking Xanax and took sublingual nitro around 7:1this morning and those alleviated his symptoms. Pt currently denies pressure. 20G in Left AC placed. Pt had 2 nitro in route. Placed on 2L oxygen, pt not normally on oxygen at home. 12 Lead done. Pt c/o headache after taking nitro. BP: 115/68, Resp: 18, HR: 57. Pt had stent placed on August 9th in [place].

## 2012-01-20 LAB — CBC
MCH: 29.6 pg (ref 26.0–34.0)
MCV: 88.3 fL (ref 78.0–100.0)
Platelets: 240 10*3/uL (ref 150–400)
RDW: 13.4 % (ref 11.5–15.5)

## 2012-01-20 LAB — BASIC METABOLIC PANEL
CO2: 27 mEq/L (ref 19–32)
Calcium: 9.6 mg/dL (ref 8.4–10.5)
Creatinine, Ser: 1.11 mg/dL (ref 0.50–1.35)
Glucose, Bld: 97 mg/dL (ref 70–99)

## 2012-01-20 LAB — CARDIAC PANEL(CRET KIN+CKTOT+MB+TROPI): Total CK: 125 U/L (ref 7–232)

## 2012-01-20 NOTE — Discharge Summary (Signed)
Discharge Summary   Patient ID: Jeffery Chapman,  MRN: [id number], DOB/AGE: 01-Jul-1970 41 y.o.  Admit date: 01/19/2012 Discharge date: 01/20/2012  Primary Physician: Elvina Sidle, MD Primary Cardiologist: Charlton Haws, MD  Discharge Diagnoses Principal Problem:  *Chest pain Active Problems:  CAD (coronary artery disease)  Mixed hyperlipidemia  Hypertension  Anxiety   Allergies Allergies  Allergen Reactions  . Hctz (Hydrochlorothiazide)     aggravates gout    Diagnostic Studies/Procedures  PA/LATERAL CHEST X-RAY - 01/19/12  Comparison: None.  Findings: Lateral view degraded by patient arm position.  Mild mid thoracic spondylosis. Midline trachea. Moderate  cardiomegaly. Mediastinal contours otherwise within normal limits.  No pleural effusion or pneumothorax. No congestive failure.  Clear lungs.  IMPRESSION:  Cardiomegaly without congestive failure.  History of Present Illness  Jeffery Chapman is a 41yo Caucasian male with the above problem list who was admitted to [facility] on 01/21/12 with chest pain.   He had underwent biodegradable stenting to his LCx in [place] earlier this month after complaining of intermittent worsening episodes of left arm and jaw pain. He was prescribed ASA/Effient/ACEi/BB/statin. He had since been doing well adhering to a low-carb diet and exercising regularly. He had been taking his medications regularly. Two to three days prior to admission, he reported generally feeling unwell and apprehensive which he described as mild "paranoia." Associated with this was mild chest discomfort. He notes that he has baseline anxiety which does affect his energy level and manifests as chest discomfort at times. He denied associated left arm or jaw pain. There was no relation to meals, position, exertion or palpation. He denies associated shortness of breath, diaphoresis, n/v, lightheadedness, palpitations, LE edema, orthopnea, PND or syncope. His  symptoms continued, and given his recent coronary event, he presented to his PCP's office. There, his symptoms were assessed and given his history, he presented to the ED.   Upon ED arrival, EKG revealed no evidence of ischemia. Initial trop-I returned WNL. CXR as above revealed no acute cardiopulmonary abnormality. VS were stable. There was no evidence of leukocytosis, although he did have a mild normocytic anemia. Chest discomfort and apprehensive sensation had resolved upon evaluation by cardiology. Given his cardiac history, he was admitted to [facility] for formal ACS rule out.   Hospital Course   He was continued on all outpatient medications. Two subsequent sets of cardiac biomarkers returned WNL. There were no acute events overnight. EKG the following morning revealed a mild, asymptomatic sinus bradycardia, but otherwise no sign of ischemia was noted. He as assessed this morning and found to be stable for discharge. His clinical picture was suspected to be more consistent with the patient's anxiety. His anginal equivalent noted on Dr. Fabio Bering office note and appreciated on hearing the patient's description of his symptoms pre-PCI seems to be left arm and jaw pain, of which he denied prior to this admission. The recommendation was made to continue all outpatient medications. He will follow-up with Dr. Eden Emms as below. Option of pursuing stress testing will be determined at that time. He was advised to follow-up with his PCP for better anxiety management. He was encouraged to continue his lifestyle changes in an effort to reduce his cardiac risk factors.   Discharge Vitals:  Blood pressure 105/66, pulse 60, temperature 98.1 F (36.7 C), temperature source Oral, resp. rate 17, height 5\' 10"  (1.778 m), weight 126.191 kg (278 lb 3.2 oz), SpO2 97.00%.   Labs: Recent Labs  [facility] 01/20/12 0615 01/19/12 1857  WBC 5.6 5.3   HGB 12.7* 12.5*   HCT 37.9* 36.8*   MCV 88.3 87.8   PLT 240  253    Lab 01/20/12 0615 01/19/12 1857 01/19/12 1222  NA 142 -- 141  K 3.9 -- 4.2  CL 105 -- 108  CO2 27 -- 22  BUN 19 -- 16  CREATININE 1.11 0.98 0.93  CALCIUM 9.6 -- 9.6  PROT -- -- --  BILITOT -- -- --  ALKPHOS -- -- --  ALT -- -- --  AST -- -- --  AMYLASE -- -- --  LIPASE -- -- --  GLUCOSE 97 -- 92   Recent Labs  Basename 01/20/12 0009 01/19/12 1856 01/19/12 1222   CKTOTAL 125 128 --   CKMB 1.9 1.9 --   CKMBINDEX -- -- --   TROPONINI <0.30 <0.30 <0.30   Disposition:  Discharge Orders    Future Appointments: Provider: Department: Dept Phone: Center:   02/09/2012 3:00 PM Wendall Stade, MD [facility] [phone] [facility]     Follow-up Information    Follow up with Charlton Haws, MD on 02/09/2012. (At 3:00 PM for follow-up after this hospitalization.)    Contact information:   [address] [place] [address] [phone]       Follow up with Elvina Sidle, MD. (In 1-2 weeks to manage chronic issues including anxiety and gout. )    Contact information:   [address] [place] [address] [phone]          Discharge Medications:  Medication List  As of 01/20/2012 11:44 AM   START taking these medications         prasugrel 10 MG Tabs   Commonly known as: EFFIENT   Take 1 tablet (10 mg total) by mouth daily.         CONTINUE taking these medications         ALPRAZolam 0.5 MG tablet   Commonly known as: XANAX      aspirin 81 MG chewable tablet      atorvastatin 80 MG tablet   Commonly known as: LIPITOR      bisoprolol 5 MG tablet   Commonly known as: ZEBETA      cholecalciferol 1000 UNITS tablet   Commonly known as: VITAMIN D      Co Q10 100 MG Tabs      FISH OIL PO      indomethacin 50 MG capsule   Commonly known as: INDOCIN      lisinopril 5 MG tablet   Commonly known as: PRINIVIL,ZESTRIL      Magnesium 500 MG Caps      nitroGLYCERIN 0.4  MG/SPRAY spray   Commonly known as: NITROLINGUAL   Place 1 spray under the tongue every 5 (five) minutes as needed for chest pain.          Where to get your medications    These are the prescriptions that you need to pick up. We sent them to a specific pharmacy, so you will need to go there to get them.   [id number] [place], [place] - [address]    [address] [place] [address]    Phone: [phone]        prasugrel 10 MG Tabs           Outstanding Labs/Studies: None  Duration of Discharge Encounter: Greater than 30 minutes including physician time.  Signed, R. Hurman Horn,  PA-C 01/20/2012, 11:44 AM

## 2012-01-20 NOTE — Progress Notes (Signed)
PIV D/C'd.  Teaching completed.  No questions or concerns reported at this time.  Patient ambulating off hall in NAD with family member at bedside.  Louretta Parma, RN

## 2012-01-20 NOTE — Progress Notes (Signed)
Patient Name: Jeffery Chapman Date of Encounter: 01/20/2012     Principal Problem:  *Chest pain Active Problems:  CAD (coronary artery disease)  Mixed hyperlipidemia  Hypertension  Anxiety    SUBJECTIVE: Feels well this AM. Specifically denies chest pain, shortness of breath, lightheadedness, diaphoresis or n/v. No further anxiety.    OBJECTIVE  Filed Vitals:   01/19/12 1731 01/19/12 2205 01/20/12 0137 01/20/12 0456  BP: 136/85 108/59 102/53 114/73  Pulse:  56 50 62  Temp: 98.1 F (36.7 C) 98.5 F (36.9 C) 97.6 F (36.4 C) 97.9 F (36.6 C)  TempSrc: Oral Oral Oral Oral  Resp: 20 20 18 18   Height: 5\' 10"  (1.778 m)     Weight: 127.5 kg (281 lb 1.4 oz)   126.191 kg (278 lb 3.2 oz)  SpO2: 97% 93% 95% 92%    Intake/Output Summary (Last 24 hours) at 01/20/12 0855 Last data filed at 01/20/12 0456  Gross per 24 hour  Intake      0 ml  Output   3275 ml  Net  -3275 ml   Weight change:   PHYSICAL EXAM  General: Obese, well developed, in no acute distress. Head: Normocephalic, atraumatic, sclera non-icteric, no xanthomas, nares are without discharge.  Neck: Supple without bruits or JVD. Lungs:  Resp regular and unlabored, CTAB without wheezes, rales or rhonchi Heart: RRR no s3, s4, or murmurs. Abdomen: Soft, non-tender, non-distended, BS + x 4.  Msk:  Strength and tone appears normal for age. Extremities: No clubbing, cyanosis or edema. DP/PT/Radials 2+ and equal bilaterally. Neuro: Alert and oriented X 3. Moves all extremities spontaneously. Psych:  Normal affect.  LABS:  Recent Labs  [facility] 01/20/12 0615 01/19/12 1857   WBC 5.6 5.3   HGB 12.7* 12.5*   HCT 37.9* 36.8*   MCV 88.3 87.8   PLT 240 253   Lab 01/20/12 0615 01/19/12 1857 01/19/12 1222  NA 142 -- 141  K 3.9 -- 4.2  CL 105 -- 108  CO2 27 -- 22  BUN 19 -- 16  CREATININE 1.11 0.98 0.93  CALCIUM 9.6 -- 9.6  PROT -- -- --  BILITOT -- -- --  ALKPHOS -- -- --  ALT -- -- --  AST -- -- --    AMYLASE -- -- --  LIPASE -- -- --  GLUCOSE 97 -- 92   Recent Labs  Basename 01/20/12 0009 01/19/12 1856 01/19/12 1222   CKTOTAL 125 128 --   CKMB 1.9 1.9 --   CKMBINDEX -- -- --   TROPONINI <0.30 <0.30 <0.30    TELE: NSR/sinus bradycardia, high 40s-70s  ECG: sinus bradycardia, 54 bpm, no ST-T wave changes   Radiology/Studies:  Dg Chest 2 View  01/19/2012  *RADIOLOGY REPORT*  Clinical Data: Chest pain/pressure.  CHEST - 2 VIEW  Comparison: None.  Findings: Lateral view degraded by patient arm position.  Mild mid thoracic spondylosis. Midline trachea.  Moderate cardiomegaly. Mediastinal contours otherwise within normal limits. No pleural effusion or pneumothorax.  No congestive failure.  Clear lungs.  IMPRESSION: Cardiomegaly without congestive failure.   Original Report Authenticated By: Consuello Bossier, M.D.     Current Medications:     . acetaminophen  650 mg Oral Once  . aspirin  81 mg Oral Daily  . atorvastatin  80 mg Oral QPM  . bisoprolol  5 mg Oral q morning - 10a  . enoxaparin (LOVENOX) injection  40 mg Subcutaneous Q24H  . lisinopril  5 mg Oral  QPM  . prasugrel  10 mg Oral Daily  . DISCONTD:  morphine injection  4 mg Intravenous Once  . DISCONTD: ondansetron  4 mg Intravenous Once    ASSESSMENT:  1. Atypical chest pain  2. CAD  3. HTN  4. Hyperlipidemia  5. History of tobacco abuse  6. Anxiety  7. Gout  8. Lymphadenopathy  DISCUSSION/PLAN:  Patient is has a recent and new CAD history (s/p biodegradable stent to LCx distal to OM1 in [place] earlier this month). He had been doing well post-PCI, however presented to [facility] ED with atypical chest discomfort and was admitted for ACS rule out. The patient denies any further episodes of chest pain overnight. Trop-I WNL x 3. No acute arrhythmic events overnight. CBC and BMET unremarkable. Patient's chest pain is fairly atypical and is more consistent with his acute bouts of anxiety. Plan on admission was for discharge if  he ruled out and follow-up with Dr. Eden Emms in the office. Will discuss this disposition with rounding MD. Will arrange discharge if this is the decided plan. Continue outpatient medications. Lymphadenopathy has improved, may have been representative of a viral URI. No gouty attacks. Advised to limit the use of NSAIDs. No symptoms of anxiety overnight. Follow-up with PCP for these issues. Praised patient on his tobacco cessation, dietary modification and exercise. Advised to continue to do this in an effort to control his cardiac risk factors.     Signed, R. Hurman Horn, PA-C  REvieweed th 01/20/2012, 8:55 AM Reviewed the importane of weight loss, OSA therapy compliance exercise and teh potential for nicotine withdrawal to foster hyperphagia  Ok to discharge on current meds] \ Sherryl Manges, MD 01/20/2012 11:22 AM

## 2012-01-24 ENCOUNTER — Ambulatory Visit (INDEPENDENT_AMBULATORY_CARE_PROVIDER_SITE_OTHER): Payer: BC Managed Care – PPO | Admitting: Family Medicine

## 2012-01-24 ENCOUNTER — Encounter: Payer: Self-pay | Admitting: Family Medicine

## 2012-01-24 VITALS — BP 118/68 | HR 60 | Temp 98.2°F | Resp 18 | Ht 69.5 in | Wt 280.0 lb

## 2012-01-24 DIAGNOSIS — F410 Panic disorder [episodic paroxysmal anxiety] without agoraphobia: Secondary | ICD-10-CM

## 2012-01-24 DIAGNOSIS — F411 Generalized anxiety disorder: Secondary | ICD-10-CM

## 2012-01-24 MED ORDER — ALPRAZOLAM 0.5 MG PO TABS: 0.5000 mg | ORAL_TABLET | Freq: Every evening | ORAL | Status: DC | PRN

## 2012-01-24 MED ORDER — FLUOXETINE HCL 20 MG PO TABS: 20.0000 mg | ORAL_TABLET | Freq: Every day | ORAL | Status: DC

## 2012-01-24 NOTE — Progress Notes (Signed)
  Subjective:    Patient ID: Jeffery Chapman, male    DOB: September 11, 1970, 41 y.o.   MRN: [id number]  HPI  Patient presents with worsening symptoms of anxiety, notices situations which are not normally irritating to him have been causing anxiety symptoms. Using Xanax prn mostly at bedtime. Has had recent stent placed, 21 days ago, in [place] while on vacation. Had worsening of chest pain and went for emergency treatment. Patient trying to lose weight and get back to healthier life style. Chest pains and headache have lessened since his recent cardiac procedure.   Review of Systems 3 weeks ago had stent placed while in [place].  Has had followup cardiac studies and has been given all clear by Dr. Eden Emms.  Any chest or shoulder discomfort stimulates his anxiety, though.  He is exercising now and losing weight.    Objective:   Physical Exam  Obese white male, with symptoms of anxiety.       Assessment & Plan:  Fluoxitine 20mg  take one daily also given renewal of Xanax 0.5mg  # 60 Also, will take xanaflex at hs (has own) Will continue weight loss efforts

## 2012-02-03 ENCOUNTER — Telehealth: Payer: Self-pay | Admitting: Cardiovascular Disease

## 2012-02-03 NOTE — Telephone Encounter (Signed)
PER PT'S WIFE WOULD LIKE TO BE REFERRED TO NUTRITIONIST DIET  TEACHING  FOR WEIGHT LOSS  IS EXERCISING EVERY AM  PER WIFE  ORDER ENTERED  .Zack Seal

## 2012-02-03 NOTE — Telephone Encounter (Signed)
plz return call to patient wife [phone] regarding  referral to nutritionist.

## 2012-02-04 NOTE — Telephone Encounter (Signed)
Appointment scheduled for 03/08/12 @ 10:45

## 2012-02-09 ENCOUNTER — Encounter: Payer: Self-pay | Admitting: Cardiovascular Disease

## 2012-02-09 ENCOUNTER — Ambulatory Visit (INDEPENDENT_AMBULATORY_CARE_PROVIDER_SITE_OTHER): Payer: BC Managed Care – PPO | Admitting: Cardiovascular Disease

## 2012-02-09 VITALS — BP 119/76 | HR 54 | Ht 72.0 in | Wt 279.0 lb

## 2012-02-09 DIAGNOSIS — I2510 Atherosclerotic heart disease of native coronary artery without angina pectoris: Secondary | ICD-10-CM

## 2012-02-09 DIAGNOSIS — E782 Mixed hyperlipidemia: Secondary | ICD-10-CM

## 2012-02-09 DIAGNOSIS — F419 Anxiety disorder, unspecified: Secondary | ICD-10-CM

## 2012-02-09 DIAGNOSIS — I10 Essential (primary) hypertension: Secondary | ICD-10-CM

## 2012-02-09 DIAGNOSIS — F411 Generalized anxiety disorder: Secondary | ICD-10-CM

## 2012-02-09 NOTE — Progress Notes (Signed)
Patient ID: Jeffery Chapman, male   DOB: [birth year], 41 y.o.   MRN: [id number] 41 yo Rx for circumflex MI in [place] 01/02/12 :Presented with chest pain and Troponin 9. No acute ST elevation. 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 CRF;s HTN and elevated lipids Anginal pain appears to be left arm and jaw. Has had chronic central chest pain for years thought due to anxiety on xanax for years. Since coming home following low carb diet and walking on treadmill at [facility]. No pains. Taking meds but needs scripts  Hospitalized at end of August with paranoia and chest pain.  D/C 8/26 with R/O no stress testing done  Started on prosac by [facility]  ROS: Denies fever, malais, weight loss, blurry vision, decreased visual acuity, cough, sputum, SOB, hemoptysis, pleuritic pain, palpitaitons, heartburn, abdominal pain, melena, lower extremity edema, claudication, or rash.  All other systems reviewed and negative  General: Affect appropriate Healthy:  appears stated age HEENT: normal Neck supple with no adenopathy JVP normal no bruits no thyromegaly Lungs clear with no wheezing and good diaphragmatic motion Heart:  S1/S2 no murmur, no rub, gallop or click PMI normal Abdomen: benighn, BS positve, no tenderness, no AAA no bruit.  No HSM or HJR Distal pulses intact with no bruits No edema Neuro non-focal Skin warm and dry No muscular weakness   Current Outpatient Prescriptions  Medication Sig Dispense Refill  . ALPRAZolam (XANAX) 0.5 MG tablet Take 0.5-0.75 mg by mouth at bedtime as needed.      Marland Kitchen aspirin 81 MG chewable tablet Chew 81 mg by mouth daily.      Marland Kitchen atorvastatin (LIPITOR) 80 MG tablet Take 80 mg by mouth every evening.      . bisoprolol (ZEBETA) 5 MG tablet Take 5 mg by mouth every morning.      . cholecalciferol (VITAMIN D) 1000 UNITS tablet Take 1,000 Units by mouth daily.      . Coenzyme Q10 (CO Q10) 100 MG TABS Take 100 mg by  mouth daily.      Marland Kitchen FLUoxetine (PROZAC) 20 MG tablet Take 1 tablet (20 mg total) by mouth daily.  30 tablet  5  . indomethacin (INDOCIN) 50 MG capsule Take 100 mg by mouth 3 (three) times daily as needed. For gout      . lisinopril (PRINIVIL,ZESTRIL) 5 MG tablet Take 5 mg by mouth every evening.      . Magnesium 500 MG CAPS Take 500 mg by mouth daily.      . nitroGLYCERIN (NITROLINGUAL) 0.4 MG/SPRAY spray Place 1 spray under the tongue every 5 (five) minutes as needed for chest pain.  12 g  1  . Omega-3 Fatty Acids (FISH OIL PO) Take 1 capsule by mouth daily.      . prasugrel (EFFIENT) 10 MG TABS Take 1 tablet (10 mg total) by mouth daily.  30 tablet  6    Allergies  Hctz  Electrocardiogram:  Assessment and Plan

## 2012-02-09 NOTE — Assessment & Plan Note (Signed)
Well controlled.  Continue current medications and low sodium Dash type diet.    

## 2012-02-09 NOTE — Assessment & Plan Note (Signed)
Improved continue xanax and prosac F/U primary

## 2012-02-09 NOTE — Assessment & Plan Note (Signed)
Cholesterol is at goal.  Continue current dose of statin and diet Rx.  No myalgias or side effects.  F/U  LFT's in 6 months. No results found for this basename: LDLCALC             

## 2012-02-09 NOTE — Assessment & Plan Note (Signed)
Stable with no angina and good activity level.  Continue medical Rx  

## 2012-02-09 NOTE — Patient Instructions (Signed)
Your physician recommends that you schedule a follow-up appointment in:  3 MONTHS WITH  DR NISHAN  Your physician recommends that you continue on your current medications as directed. Please refer to the Current Medication list given to you today.  

## 2012-02-21 ENCOUNTER — Encounter: Payer: Self-pay | Admitting: Family Medicine

## 2012-02-21 ENCOUNTER — Ambulatory Visit (INDEPENDENT_AMBULATORY_CARE_PROVIDER_SITE_OTHER): Payer: BC Managed Care – PPO | Admitting: Family Medicine

## 2012-02-21 VITALS — BP 118/76 | HR 65 | Temp 98.2°F | Resp 16 | Ht 71.5 in | Wt 273.2 lb

## 2012-02-21 DIAGNOSIS — R05 Cough: Secondary | ICD-10-CM

## 2012-02-21 DIAGNOSIS — M25569 Pain in unspecified knee: Secondary | ICD-10-CM

## 2012-02-21 DIAGNOSIS — J069 Acute upper respiratory infection, unspecified: Secondary | ICD-10-CM

## 2012-02-21 MED ORDER — TRAMADOL HCL 50 MG PO TABS: 50.0000 mg | ORAL_TABLET | Freq: Four times a day (QID) | ORAL | Status: DC | PRN

## 2012-02-21 MED ORDER — BENZONATATE 100 MG PO CAPS: 100.0000 mg | ORAL_CAPSULE | Freq: Three times a day (TID) | ORAL | Status: DC | PRN

## 2012-02-21 MED ORDER — AZITHROMYCIN 250 MG PO TABS: ORAL_TABLET | ORAL | Status: DC

## 2012-02-21 NOTE — Patient Instructions (Signed)
Start mucinex over the counter (blue container), or tessalon if needed for cough during day.  If not improving in next few days - can start antibiotic.  Stop ibuprofen and any other nsaids. Tylenol OR tramadol for knee pain and follow up with Dr. Luiz Blare as scheduled.  Decrease exercise intensity for now.  Return to the clinic or go to the nearest emergency room if any of your symptoms worsen or new symptoms occur.

## 2012-02-21 NOTE — Progress Notes (Signed)
  Subjective:    Patient ID: Jeffery Chapman, male    DOB: 1970-07-27, 41 y.o.   MRN: [id number]  HPI Jeffery Chapman is a 41 y.o. male  L knee pain - inside part of knee.  Had seen Dr. Luiz Blare outside leg pain - peroneal tendinitis. - had 1 therapy visit.  Started experiencing inside part of knee about a week and a half ago - told by pt it was likely pes anserine bursitis. Has been working out 5-6 days per week, but no known specific injury. Has been trying to run on it.  Tx: otc antiinflammatory - 800mg  ibuprofen up to 2-3 times per day. Next ov with Dr. Luiz Blare next week.  Occasional tylenol.   8 days ago - cough, nasal and chest congestion.  More cough.  Productive cough.  Feels chilled at times, but no measured fever.  Multiple sick contacts, including kids at home with respiratory sx's.  No insomnia.  Cough at times.  Tx: none.  Had cardiac stent placed in [place] after rise in troponin and chest and arm pain -  August 9th for circumflex coronary disease.  Cards: Nishan   Review of Systems  Constitutional: Negative for fever and chills.  Respiratory: Positive for cough. Negative for shortness of breath.   Musculoskeletal: Positive for arthralgias.       Objective:   Physical Exam  Constitutional: He is oriented to person, place, and time. He appears well-developed and well-nourished.  HENT:  Head: Normocephalic and atraumatic.  Pulmonary/Chest: Effort normal and breath sounds normal. No respiratory distress. He has no wheezes.  Musculoskeletal:       Left knee: He exhibits deformity. He exhibits normal range of motion, no swelling, no effusion and no ecchymosis. tenderness found. Medial joint line tenderness noted.       Legs: Neurological: He is alert and oriented to person, place, and time.  Skin: Skin is warm. No rash noted. No erythema.  Psychiatric: He has a normal mood and affect.         Assessment & Plan:  ONTARIO PETTENGILL is a 41 y.o. male 1. URI (upper respiratory  infection)  benzonatate (TESSALON) 100 MG capsule, azithromycin (ZITHROMAX) 250 MG tablet  2. Cough  benzonatate (TESSALON) 100 MG capsule, azithromycin (ZITHROMAX) 250 MG tablet  3. Knee pain  traMADol (ULTRAM) 50 MG tablet   L knee pain - probable oa flair vs component of pes anserine bursitis. No effusion noted. With underlying CAD, advised against NSAID use.  Start with tylenol otc, or if increased pain - can take ultram up to every 6 hours as needed. Follow up with Dr. Luiz Blare this week.  URI with possible early bronchitis. mucinex (ot Mucinex D) otc prn, fluids, relative rest, tesslaon tid prn, and if not improving in few days can start z pak.  rtc precuations.

## 2012-03-08 ENCOUNTER — Encounter: Payer: Self-pay | Admitting: *Deleted

## 2012-03-08 ENCOUNTER — Encounter: Payer: BC Managed Care – PPO | Attending: Cardiovascular Disease | Admitting: *Deleted

## 2012-03-08 DIAGNOSIS — E669 Obesity, unspecified: Secondary | ICD-10-CM | POA: Insufficient documentation

## 2012-03-08 DIAGNOSIS — Z713 Dietary counseling and surveillance: Secondary | ICD-10-CM | POA: Insufficient documentation

## 2012-03-08 NOTE — Progress Notes (Signed)
  Medical Nutrition Therapy:  Appt start time: 0945 end time:  1045.  Assessment:  Primary concerns today: patient here for obesity. 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 He lives with his wife and sons, participating in meal preparations. He is currently following the Paleo Diet and avoids grains, sugars, and any other carbohydrates for now and choosing only meats, fats and low carb vegetables. He states a weight loss of 20 pounds in past 8 weeks and feels the weight loss is the primary goal to reduce risk for future cardiac complications. He states he is going to the gym almost every morning before work and works on [employer], commercial then walks/jogs on treadmill for 3/4 to 2 miles.  MEDICATIONS: see list   DIETARY INTAKE:  Usual eating pattern includes 3 meals and 0-2 snacks per day.  Everyday foods include meats and vegetables.  Avoided foods include oats, wheat, sugar, juices, coffee, tea .    24-hr recall:  B ( AM): normally yes unless fasting: 2-3 eggs, 2 slices tomato OR sausage egg McMuffin without bread, water or diet soda Snk ( AM): not anymore unless nuts (no peanuts), OR beef jerky OR string cheese L ( PM): fasts 2 days a week OR chicken salad OR 12" steak and cheese salad OR 12 oz Rib eye  Snk ( PM): same as AM D ( PM): salad, meat hot or lunch meat with cheese, water or diet soda Snk ( PM): protein such as cheese sticks, salami, or tomato Beverages: water or diet soda  Usual physical activity: works out 4-6 days a week at gym before work - [employer] and then walks 0.75 to 2 miles on treadmill, sprinting as able.  Estimated energy needs: 1600 calories 180 g carbohydrates 120 g protein 44 g fat  TANITA  BODY COMP RESULTS 03/08/2012 Weight 274.5 lb   %Fat 41.1 %   Fat Mass (lbs) 113.0 lb   Fat Free Mass (lbs) 161.5 lb   Total Body Water (lbs) 118.0 lb   Progress Towards Goal(s):  In progress.   Nutritional Diagnosis:  NI-1.7  Predicted excessive energy intake As related to activity level.  As evidenced by BMI of 38.3.    Intervention:  Nutrition counseling provided answering questions on methods for weight loss. He is very happy with weight loss so far and is not open to any suggestions of increasing his carbohydrate intake yet. I also discussed his high fat intake, the caloric value of all macro-nutrients and the risk of consuming high proportions of saturated and trans fats. He did express willingness to assess his saturated fat intake and decrease frequency and portion sizes of these foods. Encouraged him to continue with his current activity plan. Weighed patient on Tanita Scale to assess total weight, % body fat and fat free mass. Plan follow up in 6 weeks to assess progress.    Handouts given during visit include:  [facility] Eating handout Carb Counting and Food Label handouts  Monitoring/Evaluation:  Dietary intake, exercise, reading food labels, and body weight in 6 week(s).

## 2012-03-08 NOTE — Patient Instructions (Signed)
Plan: Aim for  Aim for Continue Continue Consider Consider  

## 2012-03-10 ENCOUNTER — Ambulatory Visit: Payer: Self-pay | Admitting: Cardiovascular Disease

## 2012-04-12 ENCOUNTER — Telehealth: Payer: Self-pay | Admitting: Cardiovascular Disease

## 2012-04-12 NOTE — Telephone Encounter (Signed)
New problem:    C/o minor chest discomfort .

## 2012-04-13 ENCOUNTER — Encounter: Payer: Self-pay | Admitting: *Deleted

## 2012-04-13 ENCOUNTER — Encounter: Payer: Self-pay | Admitting: Cardiovascular Disease

## 2012-04-13 ENCOUNTER — Ambulatory Visit (INDEPENDENT_AMBULATORY_CARE_PROVIDER_SITE_OTHER): Payer: Self-pay | Admitting: Cardiovascular Disease

## 2012-04-13 VITALS — BP 127/74 | HR 50 | Ht 71.0 in | Wt 270.0 lb

## 2012-04-13 DIAGNOSIS — Z01810 Encounter for preprocedural cardiovascular examination: Secondary | ICD-10-CM

## 2012-04-13 LAB — CBC WITH DIFFERENTIAL/PLATELET
Basophils Absolute: 0 10*3/uL (ref 0.0–0.1)
Eosinophils Absolute: 0.2 10*3/uL (ref 0.0–0.7)
Hemoglobin: 14.6 g/dL (ref 13.0–17.0)
Lymphocytes Relative: 42.6 % (ref 12.0–46.0)
MCHC: 33.7 g/dL (ref 30.0–36.0)
MCV: 87.9 fl (ref 78.0–100.0)
Monocytes Absolute: 0.5 10*3/uL (ref 0.1–1.0)
Neutro Abs: 2.2 10*3/uL (ref 1.4–7.7)
RDW: 14.6 % (ref 11.5–14.6)

## 2012-04-13 LAB — BASIC METABOLIC PANEL
Calcium: 9.4 mg/dL (ref 8.4–10.5)
Chloride: 106 mEq/L (ref 96–112)
Creatinine, Ser: 0.9 mg/dL (ref 0.4–1.5)
GFR: 98.14 mL/min (ref >60.00)

## 2012-04-13 LAB — PROTIME-INR
INR: 1.1 ratio — ABNORMAL HIGH (ref 0.8–1.0)
Prothrombin Time: 11.6 s (ref 10.2–12.4)

## 2012-04-13 NOTE — Telephone Encounter (Signed)
PT AWARE  APPT MADE WITH DR Eden Emms TODAY   HAD OPENING AT   2:30 PM./CY

## 2012-04-13 NOTE — Assessment & Plan Note (Signed)
Well controlled.  Continue current medications and low sodium Dash type diet.    

## 2012-04-13 NOTE — Assessment & Plan Note (Signed)
F/U with primary consider change to clonipin from xanax.  If cors ok suggests that this is major issue

## 2012-04-13 NOTE — Assessment & Plan Note (Signed)
Stent to OM 8/13  One hospital stay already for chest pain Recurrent with relief with nitro.  Cath indicated to assess stent patency.  Lab work today will schedule in [facility] lab with [facility]

## 2012-04-13 NOTE — Progress Notes (Signed)
Patient ID: Jeffery Chapman, male   DOB: 09/29/1970, 41 y.o.   MRN: [id number] 41 yo Rx for circumflex MI in [place] 01/02/12 :Presented with chest pain and Troponin 9. No acute ST elevation. 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 CRF;s HTN and elevated lipids Anginal pain appears to be left arm and jaw. Has had chronic central chest pain for years thought due to anxiety on xanax for years. Since coming home following low carb diet and walking on treadmill at [employer]. No pains. Taking meds but needs scripts  Hospitalized at end of August with paranoia and chest pain. D/C 8/26 with R/O no stress testing done Started on prosac by [facility]  Seen as add on today with more left arm pain and chest pain. Some exertional.  This time took nitro with relief.  Anxiety is still an issue.  Also getting occipital headaches which had been relieved after his stent in [place]   ROS: Denies fever, malais, weight loss, blurry vision, decreased visual acuity, cough, sputum, SOB, hemoptysis, pleuritic pain, palpitaitons, heartburn, abdominal pain, melena, lower extremity edema, claudication, or rash.  All other systems reviewed and negative  General: Affect appropriate Obese anxious male HEENT: normal Neck supple with no adenopathy JVP normal no bruits no thyromegaly Lungs clear with no wheezing and good diaphragmatic motion Heart:  S1/S2 no murmur, no rub, gallop or click PMI normal Abdomen: benighn, BS positve, no tenderness, no AAA no bruit.  No HSM or HJR Distal pulses intact with no bruits No edema Neuro non-focal Skin warm and dry No muscular weakness   Current Outpatient Prescriptions  Medication Sig Dispense Refill  . ALPRAZolam (XANAX) 0.5 MG tablet Take 0.5-0.75 mg by mouth at bedtime as needed.      . aspirin 81 MG chewable tablet Chew 81 mg by mouth daily.      . atorvastatin (LIPITOR) 80 MG tablet Take 80 mg by mouth every evening.       . azithromycin (ZITHROMAX) 250 MG tablet Take 2 pills by mouth on day 1, then 1 pill by mouth per day on days 2 through 5.  6 each  0  . bisoprolol (ZEBETA) 5 MG tablet Take 5 mg by mouth every morning.      . cholecalciferol (VITAMIN D) 1000 UNITS tablet Take 10,000 Units by mouth daily.       . Coenzyme Q10 (CO Q10) 100 MG TABS Take 100 mg by mouth daily.      . FLUoxetine (PROZAC) 20 MG tablet Take 1 tablet (20 mg total) by mouth daily.  30 tablet  5  . indomethacin (INDOCIN) 50 MG capsule Take 100 mg by mouth 3 (three) times daily as needed. For gout      . lisinopril (PRINIVIL,ZESTRIL) 5 MG tablet Take 5 mg by mouth every evening.      . Magnesium 500 MG CAPS Take 500 mg by mouth daily.      . nitroGLYCERIN (NITROLINGUAL) 0.4 MG/SPRAY spray Place 1 spray under the tongue every 5 (five) minutes as needed for chest pain.  12 g  1  . Omega-3 Fatty Acids (FISH OIL PO) Take 1 capsule by mouth daily.      . prasugrel (EFFIENT) 10 MG TABS Take 1 tablet (10 mg total) by mouth daily.  30 tablet  6    Allergies  Hctz  Electrocardiogram:  01/21/12  NSR rate 60 normal  Assessment and Plan  

## 2012-04-13 NOTE — Telephone Encounter (Signed)
See if PA can see or go to ER

## 2012-04-13 NOTE — Telephone Encounter (Signed)
HAD A LONG DISCUSSION WITH PT THIS AM. PT COMPLAINING OF CHEST PAIN YESTERDAY TRIED TAKING  XANAX THROUGHOUT DAY TO SEE IF  WAS ANXIETY  RELATED WITH NO RELIEF DID TAKE NTG  TWICE YESTERDAY WITH SOME RELIEF NOTED   IS COMPLAINING OF  LEFT ARM PAIN TODAY  NOT SURE IF IS MUSCULAR,ANXIETY, OR ANGINA PER PT   HAS NOTED OVER THE LAST 2 WEEKS AND FEELS IS GETTING WORSE PT AWARE WILL FORWARD TO DR Eden Emms FOR REVIEW .Zack Seal

## 2012-04-13 NOTE — Patient Instructions (Addendum)
Your physician has requested that you have a cardiac catheterization. Cardiac catheterization is used to diagnose and/or treat various heart conditions. Doctors may recommend this procedure for a number of different reasons. The most common reason is to evaluate chest pain. Chest pain can be a symptom of coronary artery disease (CAD), and cardiac catheterization can show whether plaque is narrowing or blocking your heart's arteries. This procedure is also used to evaluate the valves, as well as measure the blood flow and oxygen levels in different parts of your heart. For further information please visit [email or web address] Please follow instruction sheet, as given. TOMORROW WITH DR Excell Seltzer Your physician recommends that you continue on your current medications as directed. Please refer to the Current Medication list given to you today. Your physician recommends that you return for lab work in: TODAY  BMET CBC INR  DX V72.81

## 2012-04-14 ENCOUNTER — Encounter (HOSPITAL_BASED_OUTPATIENT_CLINIC_OR_DEPARTMENT_OTHER)
Admission: RE | Disposition: A | Discharge status: Home / Self Care | Payer: Self-pay | Source: Ambulatory Visit | Attending: Cardiovascular Disease

## 2012-04-14 ENCOUNTER — Inpatient Hospital Stay (HOSPITAL_BASED_OUTPATIENT_CLINIC_OR_DEPARTMENT_OTHER)
Admission: RE | Admit: 2012-04-14 | Discharge: 2012-04-14 | Disposition: A | Discharge status: Home / Self Care | Payer: BC Managed Care – PPO | Source: Ambulatory Visit | Attending: Cardiovascular Disease | Admitting: Cardiovascular Disease

## 2012-04-14 DIAGNOSIS — I2510 Atherosclerotic heart disease of native coronary artery without angina pectoris: Secondary | ICD-10-CM

## 2012-04-14 DIAGNOSIS — E785 Hyperlipidemia, unspecified: Secondary | ICD-10-CM | POA: Insufficient documentation

## 2012-04-14 DIAGNOSIS — Z79899 Other long term (current) drug therapy: Secondary | ICD-10-CM | POA: Insufficient documentation

## 2012-04-14 DIAGNOSIS — R079 Chest pain, unspecified: Secondary | ICD-10-CM | POA: Insufficient documentation

## 2012-04-14 DIAGNOSIS — Z9861 Coronary angioplasty status: Secondary | ICD-10-CM | POA: Insufficient documentation

## 2012-04-14 DIAGNOSIS — I10 Essential (primary) hypertension: Secondary | ICD-10-CM | POA: Insufficient documentation

## 2012-04-14 SURGERY — JV LEFT HEART CATHETERIZATION WITH CORONARY ANGIOGRAM
Anesthesia: Moderate Sedation

## 2012-04-14 MED ORDER — SODIUM CHLORIDE 0.9 % IV SOLN
INTRAVENOUS | Status: DC
  Administered 2012-04-14: 10:00:00 via INTRAVENOUS

## 2012-04-14 MED ORDER — ACETAMINOPHEN 325 MG PO TABS: 650.0000 mg | ORAL_TABLET | ORAL | Status: DC | PRN

## 2012-04-14 MED ORDER — SODIUM CHLORIDE 0.9 % IV SOLN: 1.0000 mL/kg/h | INTRAVENOUS | Status: DC

## 2012-04-14 MED ORDER — ONDANSETRON HCL 4 MG/2ML IJ SOLN: 4.0000 mg | Freq: Four times a day (QID) | INTRAMUSCULAR | Status: DC | PRN

## 2012-04-14 NOTE — Progress Notes (Signed)
Positive Allen's test to right hand, spo2 98%.

## 2012-04-14 NOTE — CV Procedure (Signed)
   Cardiac Catheterization Procedure Note  Name: Jeffery Chapman MRN: [id number] DOB: [birth year]  Procedure: Left Heart Cath, Selective Coronary Angiography, LV angiography  Indication: Chest pain after recent PCI/NSTEMI   Procedural Details: The right wrist was prepped, draped, and anesthetized with 1% lidocaine. Using the modified Seldinger technique, a 5 French sheath was introduced into the right radial artery. 3 mg of verapamil was administered through the sheath, weight-based unfractionated heparin was administered intravenously. Standard Judkins catheters were used for selective coronary angiography and left ventriculography. Catheter exchanges were performed over an exchange length guidewire. There were no immediate procedural complications. A TR band was used for radial hemostasis at the completion of the procedure.  The patient was transferred to the post catheterization recovery area for further monitoring.  Procedural Findings: Hemodynamics: AO 122/76 LV 123/27  Coronary angiography: Coronary dominance: right  Left mainstem: Patent with minor luminal irregularity.  Left anterior descending (LAD): The LAD is patent to the left ventricular apex. There is mild proximal irregularity but no significant stenosis is present.  Left circumflex (LCx): There is a large intermediate branch without significant stenosis. The AV groove circumflex is patent with proximal 20-30% stenosis.  Right coronary artery (RCA): Moderate caliber dominant vessel. Mild 30% stenosis at the junction of the mid and distal vessel. There is no high-grade disease throughout the right coronary artery.  Left ventriculography: Left ventricular systolic function is normal, LVEF is estimated at 55%, there is no significant mitral regurgitation   Final Conclusions:   1. Minor nonobstructive CAD as described above 2. Normal left ventricular systolic function.  Recommendations: I do not see any significant  obstructive CAD. The patient underwent recent stenting with a bio absorbable stent platform and I am not able to visualize the stent. I suspect it has minimal radiopacity, but I do not see any significant coronary problems.  Tonny Bollman 04/14/2012, 2:01 PM

## 2012-04-14 NOTE — Interval H&P Note (Signed)
History and Physical Interval Note:  04/14/2012 1:21 PM  Jeffery Chapman  has presented today for surgery, with the diagnosis of chest pain  The various methods of treatment have been discussed with the patient and family. After consideration of risks, benefits and other options for treatment, the patient has consented to  Procedure(s) (LRB) with comments: JV LEFT HEART CATHETERIZATION WITH CORONARY ANGIOGRAM (N/A) as a surgical intervention .  The patient's history has been reviewed, patient examined, no change in status, stable for surgery.  I have reviewed the patient's chart and labs.  Questions were answered to the patient's satisfaction.     Tonny Bollman

## 2012-04-14 NOTE — H&P (View-Only) (Signed)
Patient ID: Jeffery Chapman, male   DOB: Jul 29, 1970, 41 y.o.   MRN: [id number] 41 yo Rx for circumflex MI in [place] 01/02/12 :Presented with chest pain and Troponin 9. No acute ST elevation. 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 CRF;s HTN and elevated lipids Anginal pain appears to be left arm and jaw. Has had chronic central chest pain for years thought due to anxiety on xanax for years. Since coming home following low carb diet and walking on treadmill at [facility]. No pains. Taking meds but needs scripts  Hospitalized at end of August with paranoia and chest pain. D/C 8/26 with R/O no stress testing done Started on prosac by [facility]  Seen as add on today with more left arm pain and chest pain. Some exertional.  This time took nitro with relief.  Anxiety is still an issue.  Also getting occipital headaches which had been relieved after his stent in [place]   ROS: Denies fever, malais, weight loss, blurry vision, decreased visual acuity, cough, sputum, SOB, hemoptysis, pleuritic pain, palpitaitons, heartburn, abdominal pain, melena, lower extremity edema, claudication, or rash.  All other systems reviewed and negative  General: Affect appropriate Obese anxious male HEENT: normal Neck supple with no adenopathy JVP normal no bruits no thyromegaly Lungs clear with no wheezing and good diaphragmatic motion Heart:  S1/S2 no murmur, no rub, gallop or click PMI normal Abdomen: benighn, BS positve, no tenderness, no AAA no bruit.  No HSM or HJR Distal pulses intact with no bruits No edema Neuro non-focal Skin warm and dry No muscular weakness   Current Outpatient Prescriptions  Medication Sig Dispense Refill  . ALPRAZolam (XANAX) 0.5 MG tablet Take 0.5-0.75 mg by mouth at bedtime as needed.      Marland Kitchen aspirin 81 MG chewable tablet Chew 81 mg by mouth daily.      Marland Kitchen atorvastatin (LIPITOR) 80 MG tablet Take 80 mg by mouth every evening.       Marland Kitchen azithromycin (ZITHROMAX) 250 MG tablet Take 2 pills by mouth on day 1, then 1 pill by mouth per day on days 2 through 5.  6 each  0  . bisoprolol (ZEBETA) 5 MG tablet Take 5 mg by mouth every morning.      . cholecalciferol (VITAMIN D) 1000 UNITS tablet Take 10,000 Units by mouth daily.       . Coenzyme Q10 (CO Q10) 100 MG TABS Take 100 mg by mouth daily.      Marland Kitchen FLUoxetine (PROZAC) 20 MG tablet Take 1 tablet (20 mg total) by mouth daily.  30 tablet  5  . indomethacin (INDOCIN) 50 MG capsule Take 100 mg by mouth 3 (three) times daily as needed. For gout      . lisinopril (PRINIVIL,ZESTRIL) 5 MG tablet Take 5 mg by mouth every evening.      . Magnesium 500 MG CAPS Take 500 mg by mouth daily.      . nitroGLYCERIN (NITROLINGUAL) 0.4 MG/SPRAY spray Place 1 spray under the tongue every 5 (five) minutes as needed for chest pain.  12 g  1  . Omega-3 Fatty Acids (FISH OIL PO) Take 1 capsule by mouth daily.      . prasugrel (EFFIENT) 10 MG TABS Take 1 tablet (10 mg total) by mouth daily.  30 tablet  6    Allergies  Hctz  Electrocardiogram:  01/21/12  NSR rate 60 normal  Assessment and Plan

## 2012-04-15 ENCOUNTER — Encounter: Payer: Self-pay | Admitting: *Deleted

## 2012-04-15 ENCOUNTER — Encounter: Payer: BC Managed Care – PPO | Attending: Cardiovascular Disease | Admitting: *Deleted

## 2012-04-15 DIAGNOSIS — E669 Obesity, unspecified: Secondary | ICD-10-CM | POA: Insufficient documentation

## 2012-04-15 DIAGNOSIS — Z713 Dietary counseling and surveillance: Secondary | ICD-10-CM | POA: Insufficient documentation

## 2012-04-15 NOTE — Progress Notes (Signed)
  Medical Nutrition Therapy:  Appt start time: 0845 end time:  0900.  Assessment:  Primary concerns today: patient here for follow up visit for obesity. Is very happy with weight loss! Continues with Paleo Diet and states support of weighing in is very helpful. No questions regarding food choices and is doing well with exercise too. Weighed him on Tanita Scale again, see comparisons below  MEDICATIONS: see list   DIETARY INTAKE:  Usual eating pattern includes 3 meals and 0-2 snacks per day.  Everyday foods include meats and vegetables.  Avoided foods include oats, wheat, sugar, juices, coffee, tea .    24-hr recall:  B ( AM): normally yes unless fasting: 2-3 eggs, 2 slices tomato OR sausage egg McMuffin without bread, water or diet soda Snk ( AM): not anymore unless nuts (no peanuts), OR beef jerky OR string cheese L ( PM): fasts 2 days a week OR chicken salad OR 12" steak and cheese salad OR 12 oz Rib eye  Snk ( PM): same as AM D ( PM): salad, meat hot or lunch meat with cheese, water or diet soda Snk ( PM): protein such as cheese sticks, salami, or tomato Beverages: water or diet soda  Usual physical activity: works out 4-6 days a week at gym before work - [employer] and then walks 0.75 to 2 miles on treadmill, sprinting as able.  Estimated energy needs: 1600 calories 180 g carbohydrates 120 g protein 44 g fat  TANITA  BODY COMP RESULTS 03/08/2012        Weight 274.5 lb   %Fat 41.1 %   Fat Mass (lbs) 113.0 lb   Fat Free Mass (lbs) 161.5 lb   Total Body Water (lbs) 118.0 lb   TANITA  BODY COMP RESULTS 04/15/2012  Weight 264.5 lb   %Fat 39.5 %   Fat Mass (lbs) 104.5 lb   Fat Free Mass (lbs) 160.0 lb   Total Body Water (lbs) 117.0 lb   Improvements on all levels noted and discussed.  Progress Towards Goal(s):  In progress.   Nutritional Diagnosis:  NI-1.7 Predicted excessive energy intake As related to activity level.  As evidenced by BMI of 38.3 decreased now  to 37.1    Intervention:  Patient very happy with 10 pound weight loss. Discussed results of Tanita Scale and he states he plans to continue with Paleo Diet until next July when he plans to reach his goal weight of under 200 pounds. Also discussed with him the incidence of excessive weight gain when coming off of a very low carb diet which he acknowledged and he states he plans to follow a modified plan for weight maintenance at that time.   Monitoring/Evaluation:  Dietary intake, exercise, reading food labels, and body weight in 6 week(s).

## 2012-05-10 ENCOUNTER — Encounter: Payer: Self-pay | Admitting: Cardiovascular Disease

## 2012-05-10 ENCOUNTER — Ambulatory Visit (INDEPENDENT_AMBULATORY_CARE_PROVIDER_SITE_OTHER): Payer: BC Managed Care – PPO | Admitting: Cardiovascular Disease

## 2012-05-10 VITALS — BP 110/70 | HR 66 | Resp 12

## 2012-05-10 DIAGNOSIS — I2510 Atherosclerotic heart disease of native coronary artery without angina pectoris: Secondary | ICD-10-CM

## 2012-05-10 DIAGNOSIS — F419 Anxiety disorder, unspecified: Secondary | ICD-10-CM

## 2012-05-10 DIAGNOSIS — I10 Essential (primary) hypertension: Secondary | ICD-10-CM

## 2012-05-10 DIAGNOSIS — E782 Mixed hyperlipidemia: Secondary | ICD-10-CM

## 2012-05-10 DIAGNOSIS — F411 Generalized anxiety disorder: Secondary | ICD-10-CM

## 2012-05-10 NOTE — Assessment & Plan Note (Signed)
Well controlled.  Continue current medications and low sodium Dash type diet.    

## 2012-05-10 NOTE — Patient Instructions (Signed)
Your physician wants you to follow-up in: YEAR WITH DR NISHAN  You will receive a reminder letter in the mail two months in advance. If you don't receive a letter, please call our office to schedule the follow-up appointment.  Your physician recommends that you continue on your current medications as directed. Please refer to the Current Medication list given to you today. 

## 2012-05-10 NOTE — Assessment & Plan Note (Signed)
Indicates he has good f/u for this and someone that follows him regularly.  Continue current meds

## 2012-05-10 NOTE — Assessment & Plan Note (Signed)
Cholesterol is at goal.  Continue current dose of statin and diet Rx.  No myalgias or side effects.  F/U  LFT's in 6 months. No results found for this basename: LDLCALC             

## 2012-05-10 NOTE — Assessment & Plan Note (Signed)
Stable with no angina and good activity level.  Continue medical Rx Recent cath with no disease and stent resorbed

## 2012-05-10 NOTE — Progress Notes (Signed)
Patient ID: Jeffery Chapman, male   DOB: February 09, 1971, 41 y.o.   MRN: [id number] 41 yo Rx for circumflex MI in [place] 01/02/12 :Presented with chest pain and Troponin 9. No acute ST elevation. 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 CRF;s HTN and elevated lipids Anginal pain appears to be left arm and jaw. Has had chronic central chest pain for years thought due to anxiety on xanax for years. Since coming home following low carb diet and walking on treadmill at [facility]. No pains. Taking meds but needs scripts  Hospitalized at end of August with paranoia and chest pain. D/C 8/26 with R/O no stress testing done Started on prosac by Lauenstein   Seen again 11/13 with chest pain  F/U cath by Dr Lonzo Candy with no CAD  04/14/12  Final Conclusions:  1. Minor nonobstructive CAD as described above  2. Normal left ventricular systolic function.  Recommendations: I do not see any significant obstructive CAD. The patient underwent recent stenting with a bio absorbable stent platform and I am not able to visualize the stent. I suspect it has minimal radiopacity, but I do not see any significant coronary problems.  Discussed need for close f/u with psych for anxiety disorder  ROS: Denies fever, malais, weight loss, blurry vision, decreased visual acuity, cough, sputum, SOB, hemoptysis, pleuritic pain, palpitaitons, heartburn, abdominal pain, melena, lower extremity edema, claudication, or rash.  All other systems reviewed and negative  General: Affect appropriate Obese white male HEENT: normal Neck supple with no adenopathy JVP normal no bruits no thyromegaly Lungs clear with no wheezing and good diaphragmatic motion Heart:  S1/S2 no murmur, no rub, gallop or click PMI normal Abdomen: benighn, BS positve, no tenderness, no AAA no bruit.  No HSM or HJR Distal pulses intact with no bruits No edema Neuro non-focal Skin warm and dry No muscular  weakness Right radial pulse still palpable post cath   Current Outpatient Prescriptions  Medication Sig Dispense Refill  . ALPRAZolam (XANAX) 0.5 MG tablet Take 0.5-0.75 mg by mouth at bedtime as needed.      Marland Kitchen aspirin 81 MG chewable tablet Chew 81 mg by mouth daily.      Marland Kitchen atorvastatin (LIPITOR) 80 MG tablet Take 80 mg by mouth every evening.      Marland Kitchen azithromycin (ZITHROMAX) 250 MG tablet Take 2 pills by mouth on day 1, then 1 pill by mouth per day on days 2 through 5.  6 each  0  . bisoprolol (ZEBETA) 5 MG tablet Take 5 mg by mouth every morning.      . cholecalciferol (VITAMIN D) 1000 UNITS tablet Take 10,000 Units by mouth daily.       . Coenzyme Q10 (CO Q10) 100 MG TABS Take 100 mg by mouth daily.      Marland Kitchen FLUoxetine (PROZAC) 20 MG tablet Take 1 tablet (20 mg total) by mouth daily.  30 tablet  5  . indomethacin (INDOCIN) 50 MG capsule Take 100 mg by mouth 3 (three) times daily as needed. For gout      . lisinopril (PRINIVIL,ZESTRIL) 5 MG tablet Take 5 mg by mouth every evening.      . Magnesium 500 MG CAPS Take 500 mg by mouth daily.      . nitroGLYCERIN (NITROLINGUAL) 0.4 MG/SPRAY spray Place 1 spray under the tongue every 5 (five) minutes as needed for chest pain.  12 g  1  . Omega-3 Fatty  Acids (FISH OIL PO) Take 1 capsule by mouth daily.      . prasugrel (EFFIENT) 10 MG TABS Take 1 tablet (10 mg total) by mouth daily.  30 tablet  6    Allergies  Hctz  Electrocardiogram:  Assessment and Plan

## 2012-05-27 ENCOUNTER — Encounter: Payer: Self-pay | Admitting: *Deleted

## 2012-05-27 ENCOUNTER — Encounter: Payer: BC Managed Care – PPO | Attending: Cardiovascular Disease | Admitting: *Deleted

## 2012-05-27 DIAGNOSIS — Z713 Dietary counseling and surveillance: Secondary | ICD-10-CM | POA: Insufficient documentation

## 2012-05-27 DIAGNOSIS — E669 Obesity, unspecified: Secondary | ICD-10-CM | POA: Insufficient documentation

## 2012-05-27 NOTE — Progress Notes (Signed)
  Medical Nutrition Therapy:  Appt start time: 0915 end time:  0945.  Assessment:  Primary concerns today: patient here for follow up visit for obesity. He continues to walk 1-1.5 miles with some wind sprints on treadmill with a few weights 2-4 times a week. Still happy with Paleo diet, and very happy with continued weight loss through the holidays. Weighed him on Tanita Scale again, see comparisons below  MEDICATIONS: see list   DIETARY INTAKE:  Usual eating pattern includes 3 meals and 0-2 snacks per day.  Everyday foods include meats and vegetables.  Avoided foods include oats, wheat, sugar, juices, coffee, tea .    24-hr recall:  B ( AM): normally yes unless fasting: 2-3 eggs, 2 slices tomato OR sausage egg McMuffin without bread, water or diet soda Snk ( AM): not anymore unless nuts (no peanuts), OR beef jerky OR string cheese L ( PM): fasts 2 days a week OR chicken salad OR 12" steak and cheese salad OR 12 oz Rib eye  Snk ( PM): same as AM D ( PM): salad, meat hot or lunch meat with cheese, water or diet soda Snk ( PM): protein such as cheese sticks, salami, or tomato Beverages: water or diet soda  Usual physical activity: works out 4-6 days a week at gym before work - [employer] and then walks 0.75 to 2 miles on treadmill, sprinting as able.  Estimated energy needs: 1600 calories 180 g carbohydrates 120 g protein 44 g fat  TANITA  BODY COMP RESULTS 03/08/2012        Weight 274.5 lb   %Fat 41.1 %   Fat Mass (lbs) 113.0 lb   Fat Free Mass (lbs) 161.5 lb   Total Body Water (lbs) 118.0 lb   TANITA  BODY COMP RESULTS 04/15/2012  Weight 264.5 lb   %Fat 39.5 %   Fat Mass (lbs) 104.5 lb   Fat Free Mass (lbs) 160.0 lb   Total Body Water (lbs) 117.0 lb   TANITA  BODY COMP RESULTS 05/27/2012  Weight 260.5 lb   %Fat 34.0 %   Fat Mass (lbs) 89.0 lb   Fat Free Mass (lbs) 171.5 lb   Total Body Water (lbs) 125.5 lb    Improvements on all levels noted and  discussed.  Progress Towards Goal(s):  In progress.   Nutritional Diagnosis:  NI-1.7 Predicted excessive energy intake As related to activity level.  As evidenced by BMI of 38.3 decreased now to 36.3    Intervention:  Patient content with 4 pound weight loss over the holidays. Discussed results of Tanita Scale and continued loss of body fat versus muscle mass. He states he plans to continue with Paleo Diet until next July when he plans to reach his goal weight of under 200 pounds. He has modified his workout to be less stressful with less weight lifting.  Monitoring/Evaluation:  Dietary intake, exercise, reading food labels, and body weight in 6 week(s).

## 2012-07-05 ENCOUNTER — Other Ambulatory Visit: Payer: Self-pay | Admitting: Family Medicine

## 2012-07-08 ENCOUNTER — Ambulatory Visit: Payer: BC Managed Care – PPO | Admitting: *Deleted

## 2012-07-09 ENCOUNTER — Ambulatory Visit: Payer: BC Managed Care – PPO | Admitting: *Deleted

## 2012-07-15 ENCOUNTER — Ambulatory Visit: Payer: BC Managed Care – PPO | Admitting: *Deleted

## 2012-07-27 ENCOUNTER — Ambulatory Visit: Payer: BC Managed Care – PPO | Admitting: *Deleted

## 2012-08-05 ENCOUNTER — Telehealth: Payer: Self-pay

## 2012-08-05 NOTE — Telephone Encounter (Signed)
Pt needs indocin sent to [facility] for gout in right wrist.

## 2012-08-05 NOTE — Telephone Encounter (Signed)
MARYANN STATES HER HUSBAND IS HAVING A GOUT ATTACK AND THEY WENT TO THE [facility] AND SINCE HE HAD MORE MEDICINES ON FILE, HE WAS INSTRUCTED TO CALL HIS DR AND HAVE [place] TO CALL SOMETHING IN FOR HIM. PLEASE CALL [phone]    [facility]

## 2012-08-07 ENCOUNTER — Ambulatory Visit (INDEPENDENT_AMBULATORY_CARE_PROVIDER_SITE_OTHER): Payer: BC Managed Care – PPO | Admitting: Family Medicine

## 2012-08-07 ENCOUNTER — Telehealth: Payer: Self-pay | Admitting: *Deleted

## 2012-08-07 VITALS — BP 144/82 | HR 87 | Temp 97.9°F | Resp 18 | Ht 70.5 in | Wt 273.0 lb

## 2012-08-07 DIAGNOSIS — M109 Gout, unspecified: Secondary | ICD-10-CM

## 2012-08-07 MED ORDER — HYDROCODONE-ACETAMINOPHEN 5-325MG PREPACK (~~LOC~~: 1.0000 | ORAL_TABLET | Freq: Four times a day (QID) | ORAL | Status: DC | PRN

## 2012-08-07 MED ORDER — INDOMETHACIN 50 MG PO CAPS: 100.0000 mg | ORAL_CAPSULE | Freq: Three times a day (TID) | ORAL | Status: DC | PRN

## 2012-08-07 MED ORDER — INDOMETHACIN 50 MG PO CAPS: 50.0000 mg | ORAL_CAPSULE | Freq: Three times a day (TID) | ORAL | Status: DC | PRN

## 2012-08-07 MED ORDER — HYDROCODONE-ACETAMINOPHEN 5-325 MG PO TABS: 1.0000 | ORAL_TABLET | Freq: Four times a day (QID) | ORAL | Status: DC | PRN

## 2012-08-07 NOTE — Patient Instructions (Signed)
Gout Gout is an inflammatory condition (arthritis) caused by a buildup of uric acid crystals in the joints. Uric acid is a chemical that is normally present in the blood. Under some circumstances, uric acid can form into crystals in your joints. This causes joint redness, soreness, and swelling (inflammation). Repeat attacks are common. Over time, uric acid crystals can form into masses (tophi) near a joint, causing disfigurement. Gout is treatable and often preventable. CAUSES  The disease begins with elevated levels of uric acid in the blood. Uric acid is produced by your body when it breaks down a naturally found substance called purines. This also happens when you eat certain foods such as meats and fish. Causes of an elevated uric acid level include:  Being passed down from parent to child (heredity).  Diseases that cause increased uric acid production (obesity, psoriasis, some cancers).  Excessive alcohol use.  Diet, especially diets rich in meat and seafood.  Medicines, including certain cancer-fighting drugs (chemotherapy), diuretics, and aspirin.  Chronic kidney disease. The kidneys are no longer able to remove uric acid well.  Problems with metabolism. Conditions strongly associated with gout include:  Obesity.  High blood pressure.  High cholesterol.  Diabetes. Not everyone with elevated uric acid levels gets gout. It is not understood why some people get gout and others do not. Surgery, joint injury, and eating too much of certain foods are some of the factors that can lead to gout. SYMPTOMS   An attack of gout comes on quickly. It causes intense pain with redness, swelling, and warmth in a joint.  Fever can occur.  Often, only one joint is involved. Certain joints are more commonly involved:  Base of the big toe.  Knee.  Ankle.  Wrist.  Finger. Without treatment, an attack usually goes away in a few days to weeks. Between attacks, you usually will not have  symptoms, which is different from many other forms of arthritis. DIAGNOSIS  Your caregiver will suspect gout based on your symptoms and exam. Removal of fluid from the joint (arthrocentesis) is done to check for uric acid crystals. Your caregiver will give you a medicine that numbs the area (local anesthetic) and use a needle to remove joint fluid for exam. Gout is confirmed when uric acid crystals are seen in joint fluid, using a special microscope. Sometimes, blood, urine, and X-ray tests are also used. TREATMENT  There are 2 phases to gout treatment: treating the sudden onset (acute) attack and preventing attacks (prophylaxis). Treatment of an Acute Attack  Medicines are used. These include anti-inflammatory medicines or steroid medicines.  An injection of steroid medicine into the affected joint is sometimes necessary.  The painful joint is rested. Movement can worsen the arthritis.  You may use warm or cold treatments on painful joints, depending which works best for you.  Discuss the use of coffee, vitamin C, or cherries with your caregiver. These may be helpful treatment options. Treatment to Prevent Attacks After the acute attack subsides, your caregiver may advise prophylactic medicine. These medicines either help your kidneys eliminate uric acid from your body or decrease your uric acid production. You may need to stay on these medicines for a very long time. The early phase of treatment with prophylactic medicine can be associated with an increase in acute gout attacks. For this reason, during the first few months of treatment, your caregiver may also advise you to take medicines usually used for acute gout treatment. Be sure you understand your caregiver's directions.   You should also discuss dietary treatment with your caregiver. Certain foods such as meats and fish can increase uric acid levels. Other foods such as dairy can decrease levels. Your caregiver can give you a list of foods  to avoid. HOME CARE INSTRUCTIONS   Do not take aspirin to relieve pain. This raises uric acid levels.  Only take over-the-counter or prescription medicines for pain, discomfort, or fever as directed by your caregiver.  Rest the joint as much as possible. When in bed, keep sheets and blankets off painful areas.  Keep the affected joint raised (elevated).  Use crutches if the painful joint is in your leg.  Drink enough water and fluids to keep your urine clear or pale yellow. This helps your body get rid of uric acid. Do not drink alcoholic beverages. They slow the passage of uric acid.  Follow your caregiver's dietary instructions. Pay careful attention to the amount of protein you eat. Your daily diet should emphasize fruits, vegetables, whole grains, and fat-free or low-fat milk products.  Maintain a healthy body weight. SEEK MEDICAL CARE IF:   You have an oral temperature above 102 F (38.9 C).  You develop diarrhea, vomiting, or any side effects from medicines.  You do not feel better in 24 hours, or you are getting worse. SEEK IMMEDIATE MEDICAL CARE IF:   Your joint becomes suddenly more tender and you have:  Chills.  An oral temperature above 102 F (38.9 C), not controlled by medicine. MAKE SURE YOU:   Understand these instructions.  Will watch your condition.  Will get help right away if you are not doing well or get worse. Document Released: 05/09/2000 Document Revised: 08/04/2011 Document Reviewed: 08/20/2009 ExitCare Patient Information 2013 [employer].    

## 2012-08-07 NOTE — Progress Notes (Signed)
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 He's had identical symptoms in the past treated with Indocin successfully. At the current time he is having excruciating pain and tenderness in the right wrist with some swelling.  Objective: Mild erythema over the ulnar styloid with diffuse wrist swelling and tenderness. He's unable to perform a range of motion of the right wrist.  Assessment: Acute gouty attack, recurrent. We discussed diet and local measures.   Gout attack - Plan: indomethacin (INDOCIN) 50 MG capsule, HYDROcodone-acetaminophen (VICODIN) 5-325 mg TABS

## 2012-08-07 NOTE — Telephone Encounter (Signed)
Pharmacy called to verify the doses on hydrocodone and indomethacine.

## 2012-09-19 ENCOUNTER — Other Ambulatory Visit: Payer: Self-pay | Admitting: Family Medicine

## 2012-09-20 ENCOUNTER — Other Ambulatory Visit: Payer: Self-pay | Admitting: Orthopedic Surgery

## 2012-09-20 DIAGNOSIS — M25531 Pain in right wrist: Secondary | ICD-10-CM

## 2012-09-21 ENCOUNTER — Telehealth: Payer: Self-pay | Admitting: Cardiovascular Disease

## 2012-09-21 NOTE — Telephone Encounter (Signed)
Two attempts to reach patient.  VM is ful.

## 2012-09-21 NOTE — Telephone Encounter (Signed)
New problem   Pt has a general question

## 2012-09-24 ENCOUNTER — Other Ambulatory Visit: Payer: BC Managed Care – PPO

## 2013-03-16 ENCOUNTER — Other Ambulatory Visit: Payer: Self-pay | Admitting: Cardiovascular Disease

## 2013-03-16 ENCOUNTER — Other Ambulatory Visit: Payer: Self-pay | Admitting: Physician Assistant

## 2013-03-16 MED ORDER — BISOPROLOL FUMARATE 5 MG PO TABS: ORAL_TABLET | ORAL | Status: DC

## 2013-03-16 NOTE — Telephone Encounter (Signed)
Whole tablet of zybeta and no effient needed

## 2013-03-16 NOTE — Telephone Encounter (Signed)
Jeffery Chapman, is this patient to be taking a whole Zebeta or one-half daily. He actually has been taking one half daily, but his last office notes say to take one daily. Please advise. Thanks, MI

## 2013-03-16 NOTE — Telephone Encounter (Signed)
PT  RAN OUT OF EFFIENT  APPROX   MONTH  AGO  DOES  HE NEED TO CONTINUE.?./CY

## 2013-03-23 MED ORDER — BISOPROLOL FUMARATE 5 MG PO TABS: ORAL_TABLET | ORAL | Status: DC

## 2013-05-15 ENCOUNTER — Other Ambulatory Visit: Payer: Self-pay | Admitting: Family Medicine

## 2013-07-22 IMAGING — CR DG CHEST 2V
2 series · 2 of 2 positions shown · non-contrast
Comparison: None.

CLINICAL DATA: Chest pain/pressure.

CHEST - 2 VIEW

[w chest pa]
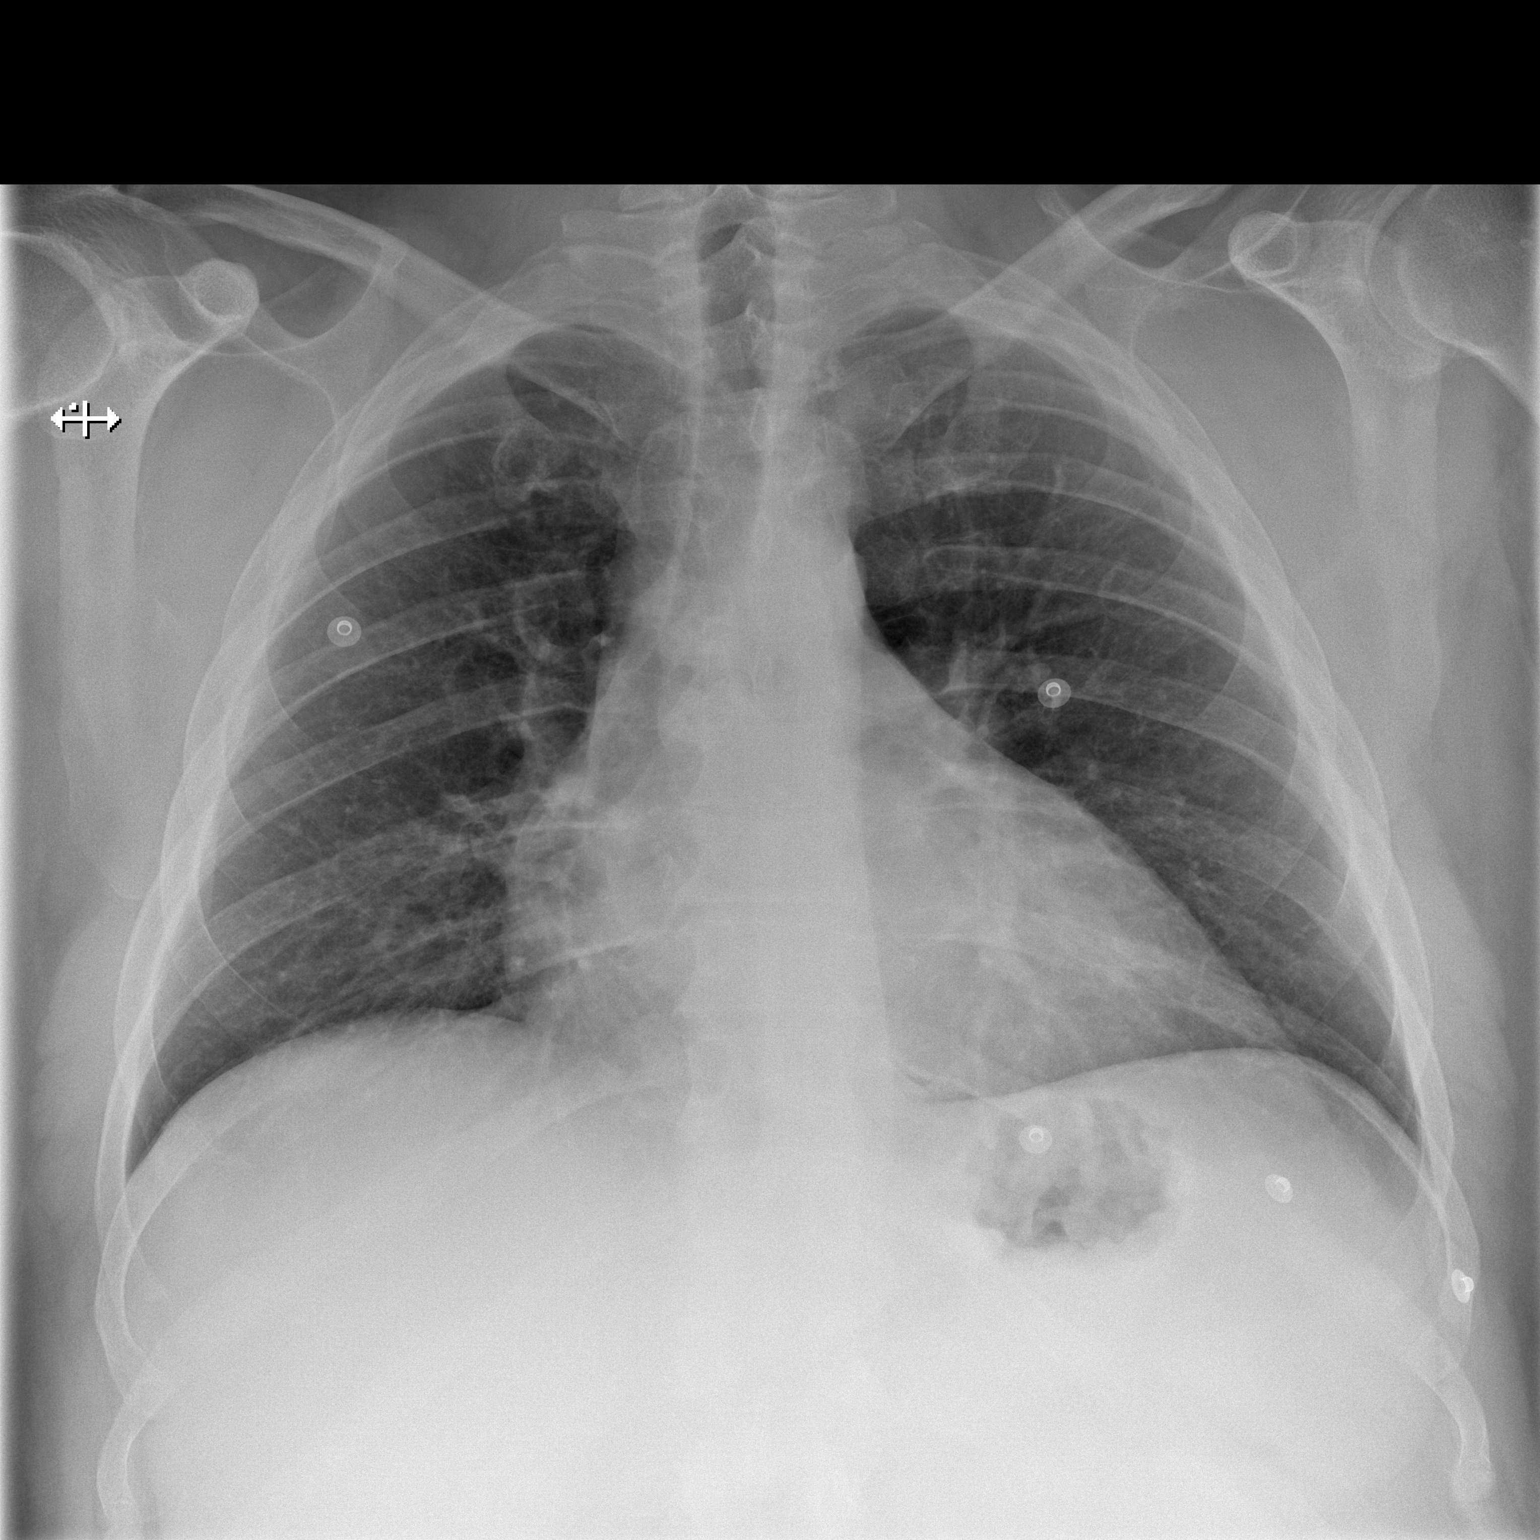

[w chest lat]
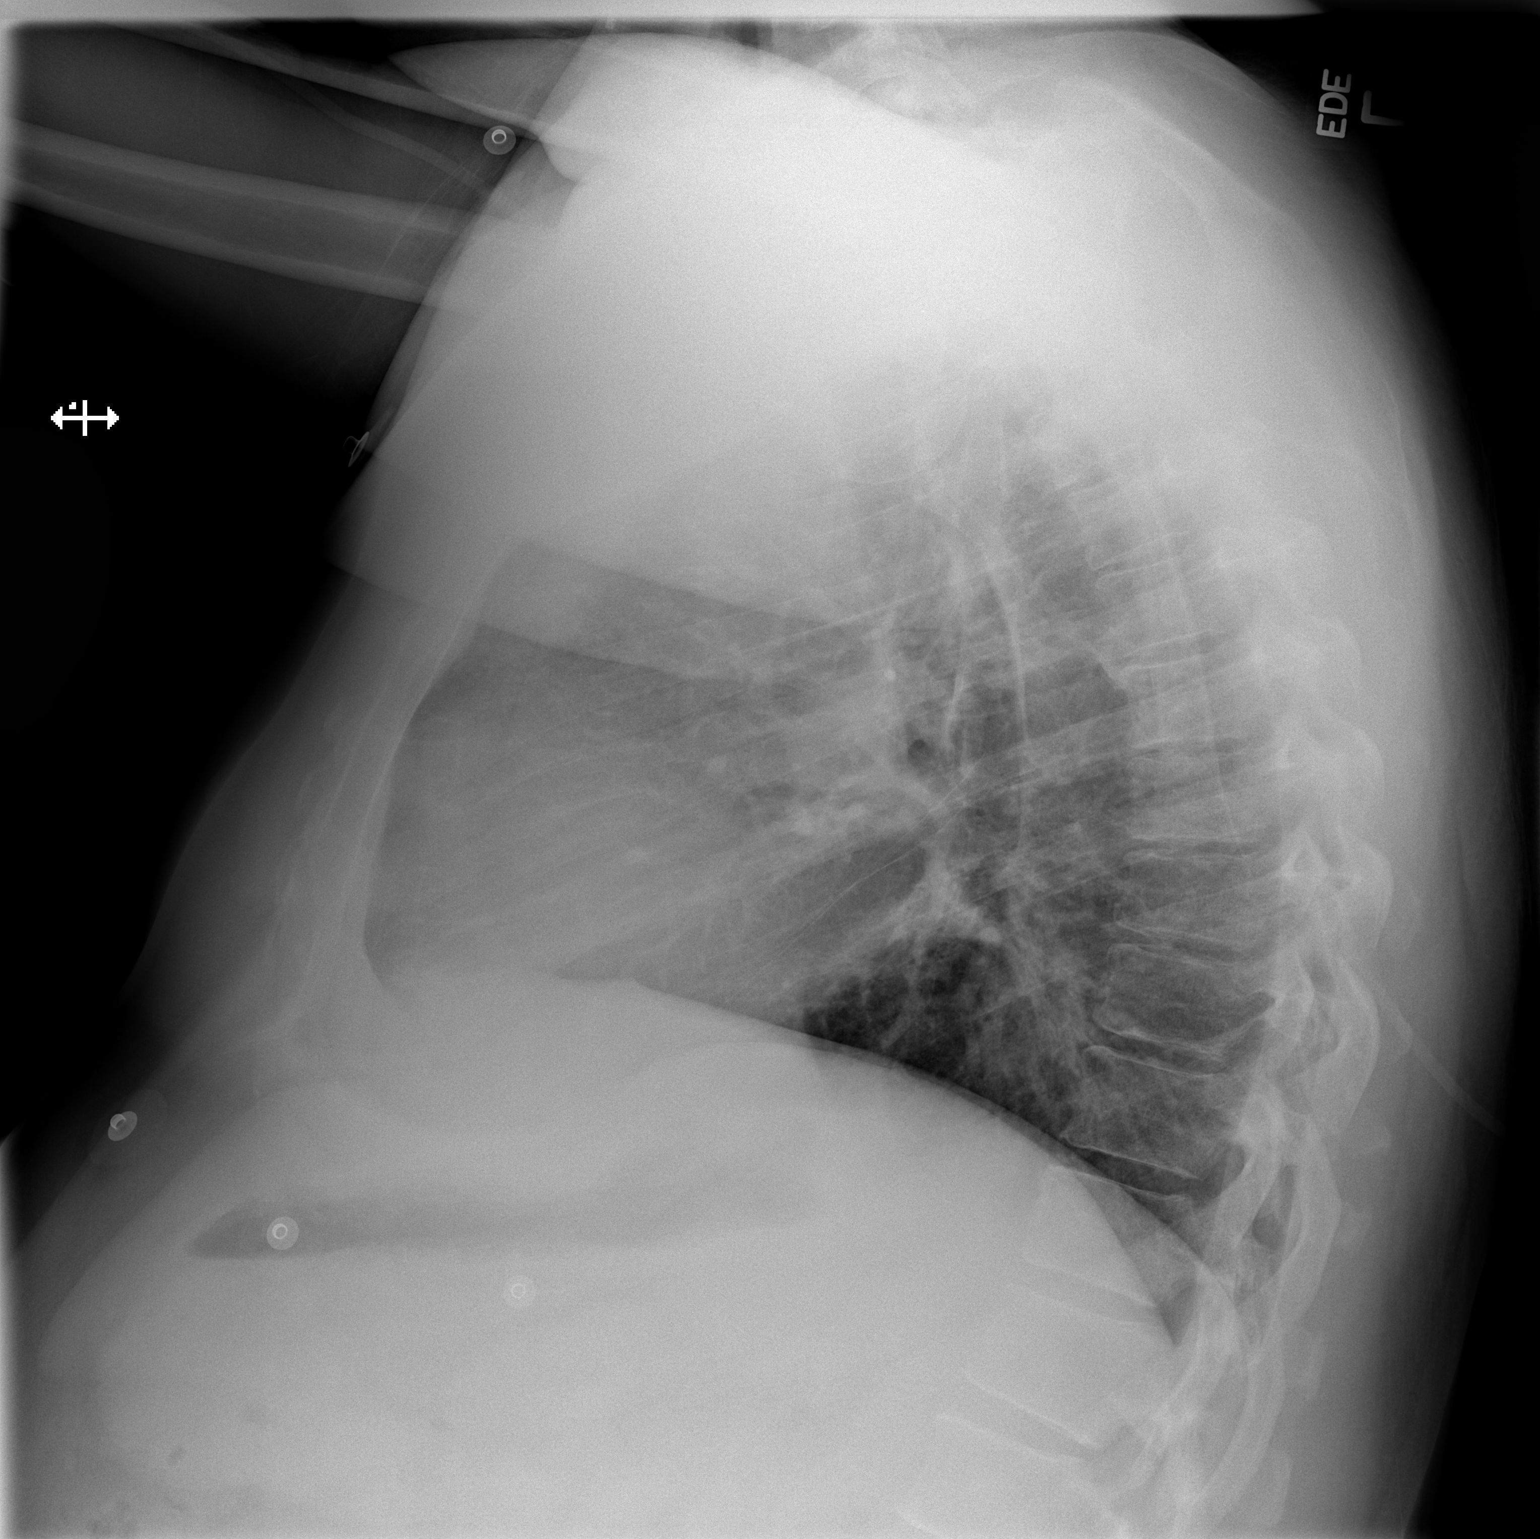

[2 of 2 positions shown; findings below may reference images not displayed]

FINDINGS: Lateral view degraded by patient arm position.

Mild mid thoracic spondylosis. Midline trachea.  Moderate
cardiomegaly. Mediastinal contours otherwise within normal limits.
No pleural effusion or pneumothorax.  No congestive failure.

Clear lungs.
IMPRESSION: Cardiomegaly without congestive failure.

## 2013-07-24 ENCOUNTER — Ambulatory Visit (INDEPENDENT_AMBULATORY_CARE_PROVIDER_SITE_OTHER): Payer: BC Managed Care – PPO | Admitting: Family Medicine

## 2013-07-24 VITALS — BP 130/70 | HR 87 | Temp 98.0°F | Resp 18 | Ht 70.5 in | Wt 312.2 lb

## 2013-07-24 DIAGNOSIS — M79609 Pain in unspecified limb: Secondary | ICD-10-CM

## 2013-07-24 DIAGNOSIS — F411 Generalized anxiety disorder: Secondary | ICD-10-CM

## 2013-07-24 DIAGNOSIS — R6882 Decreased libido: Secondary | ICD-10-CM

## 2013-07-24 DIAGNOSIS — M25519 Pain in unspecified shoulder: Secondary | ICD-10-CM

## 2013-07-24 DIAGNOSIS — M79673 Pain in unspecified foot: Secondary | ICD-10-CM

## 2013-07-24 DIAGNOSIS — F419 Anxiety disorder, unspecified: Secondary | ICD-10-CM

## 2013-07-24 LAB — COMPREHENSIVE METABOLIC PANEL
ALT: 33 U/L (ref 0–53)
AST: 23 U/L (ref 0–37)
Albumin: 4.5 g/dL (ref 3.5–5.2)
Alkaline Phosphatase: 80 U/L (ref 39–117)
BUN: 12 mg/dL (ref 6–23)
CO2: 23 mEq/L (ref 19–32)
Calcium: 9.3 mg/dL (ref 8.4–10.5)
Chloride: 106 mEq/L (ref 96–112)
Creat: 0.99 mg/dL (ref 0.50–1.35)
Glucose, Bld: 110 mg/dL — ABNORMAL HIGH (ref 70–99)
Potassium: 4 mEq/L (ref 3.5–5.3)
Sodium: 140 mEq/L (ref 135–145)
Total Bilirubin: 0.4 mg/dL (ref 0.2–1.2)
Total Protein: 7.6 g/dL (ref 6.0–8.3)

## 2013-07-24 LAB — TSH: TSH: 1.29 u[IU]/mL (ref 0.350–4.500)

## 2013-07-24 MED ORDER — SERTRALINE HCL 100 MG PO TABS: 100.0000 mg | ORAL_TABLET | Freq: Every day | ORAL | Status: DC

## 2013-07-24 MED ORDER — ALPRAZOLAM 0.5 MG PO TABS: 0.5000 mg | ORAL_TABLET | Freq: Three times a day (TID) | ORAL | Status: DC | PRN

## 2013-07-24 MED ORDER — PREDNISONE 20 MG PO TABS: ORAL_TABLET | ORAL | Status: DC

## 2013-07-24 NOTE — Progress Notes (Signed)
Subjective:    Patient ID: Jeffery Chapman, male    DOB: 04/19/1971, 43 y.o.   MRN: [id number]  HPI Chief Complaint  Patient presents with   Anxiety    Has been mild for years, worse in the past few months.   Ankle Pain    both since late Novemeber   Shoulder Pain    left shoulder off & on since November   This chart was scribed for Jeffery SidleKurt Lauenstein, MD by Andrew Auaven Small, ED Scribe. This patient was seen in room 14 and the patient's care was started at 1:59 PM.  HPI Comments: Jeffery FabianJames R Chapman is a 43 y.o. male who presents to the [facility] complaining of worsening bilateral foot pain onset 4 months. He reports that the pain is at the bottom of his heels. He reports that it feels like his bone is going to come through the bottom of his foot.He reports when he gets up in the morning he can barely walk on a hard surface. Pt states that he is hobbling around. Pt is unsure if he injured himself.   He reports that he has an appointment with an orthopedic for both foot pain and left shoulder pain. Pt is concerned about taking prednisone due to being overweight. He report that he needs to get his weight down. Pt would like to lose 20 lbs.   Pt is also complaining of left shoulder pain. Pt states that when he lifts his arm up, his shoulder feels sore. He reports that his shoulder "locks up" when holding his arm up for long periods of time.   Pt is also complaining about his anxiety. Pt states he ran out of prozac. He reports that his wife says that the medication is working but he believes it might not be strong enough. He states he takes 1 mg once or twice a day. Pt states that he is fatigued he reports that he has low energy levels.   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   Past Medical History  Diagnosis Date   Hyperlipidemia    Hypertension    Anxiety    Depression    Gout    CAD (coronary artery disease)     12/2011- PCI in [place]; s/p nondegradable stent to LAD;  nonobstructive residual LAD disease   Allergies  Allergen Reactions   Hctz [Hydrochlorothiazide]     aggravates gout   Prior to Admission medications   Medication Sig Start Date End Date Taking? Authorizing Provider  ALPRAZolam Prudy Feeler(XANAX) 0.5 MG tablet Take 0.5-0.75 mg by mouth at bedtime as needed. 09/14/11  Yes Jeffery SidleKurt Lauenstein, MD  aspirin 81 MG chewable tablet Chew 81 mg by mouth daily.   Yes Historical Provider, MD  cholecalciferol (VITAMIN D) 1000 UNITS tablet Take 10,000 Units by mouth daily.    Yes Historical Provider, MD  Coenzyme Q10 (CO Q10) 100 MG TABS Take 100 mg by mouth daily.   Yes Historical Provider, MD  FLUoxetine (PROZAC) 20 MG capsule Take 1 capsule (20 mg total) by mouth daily. Needs office visit 09/19/12  Yes Heather M Marte, PA-C  indomethacin (INDOCIN) 50 MG capsule Take 1 capsule (50 mg total) by mouth 3 (three) times daily as needed. For gout 08/07/12  Yes Jeffery SidleKurt Lauenstein, MD  lisinopril (PRINIVIL,ZESTRIL) 5 MG tablet Take 1 tablet (5 mg total) by mouth daily. PATIENT NEEDS OFFICE VISIT FOR ADDITIONAL REFILLS 03/16/13  Yes Jeffery SidleKurt Lauenstein, MD  Magnesium 500 MG CAPS Take 500  mg by mouth daily.   Yes Historical Provider, MD  Omega-3 Fatty Acids (FISH OIL PO) Take 1 capsule by mouth daily.   Yes Historical Provider, MD  prasugrel (EFFIENT) 10 MG TABS Take 1 tablet (10 mg total) by mouth daily. 01/19/12  Yes Wendall Stade, MD  tiZANidine (ZANAFLEX) 4 MG tablet Take 4 mg by mouth every 6 (six) hours as needed.   Yes Historical Provider, MD  atorvastatin (LIPITOR) 80 MG tablet TAKE 1 TABLET BY MOUTH EVERY DAY 03/16/13   Wendall Stade, MD  bisoprolol (ZEBETA) 5 MG tablet 1/2  TAB DAILY 03/23/13   Wendall Stade, MD  HYDROcodone-acetaminophen (NORCO) 5-325 MG per tablet Take 1 tablet by mouth every 6 (six) hours as needed for pain. 08/07/12   Jeffery Sidle, MD  nitroGLYCERIN (NITROLINGUAL) 0.4 MG/SPRAY spray Place 1 spray under the tongue every 5 (five) minutes as needed for  chest pain. 01/14/12 01/13/13  Wendall Stade, MD   Review of Systems  Constitutional: Positive for fatigue.  Musculoskeletal: Positive for arthralgias and myalgias.  Psychiatric/Behavioral: The patient is nervous/anxious.        Objective:   Physical Exam  Nursing note and vitals reviewed. Constitutional: He is oriented to person, place, and time. He appears well-developed and well-nourished. No distress.  HENT:  Head: Normocephalic and atraumatic.  Eyes: EOM are normal.  Neck: Neck supple.  Cardiovascular: Normal rate.   Pulmonary/Chest: Effort normal.  Musculoskeletal:  Tender heels bilaterally   Neurological: He is alert and oriented to person, place, and time.  Skin: Skin is warm and dry.  Psychiatric: He has a normal mood and affect. His behavior is normal.   Filed Vitals:   07/24/13 1350  BP: 130/70  Pulse: 87  Temp: 98 F (36.7 C)  TempSrc: Oral  Resp: 18  Height: 5' 10.5" (1.791 m)  Weight: 312 lb 4 oz (141.636 kg)  SpO2: 95%       Assessment & Plan:  Heel pain - Plan: Comprehensive metabolic panel, predniSONE (DELTASONE) 20 MG tablet  Shoulder pain - Plan: Comprehensive metabolic panel  Anxiety - Plan: TSH, ALPRAZolam (XANAX) 0.5 MG tablet, sertraline (ZOLOFT) 100 MG tablet  Decreased libido - Plan: Testosterone, free, total  Signed, Jeffery Sidle, MD

## 2013-07-24 NOTE — Patient Instructions (Signed)

## 2013-07-25 ENCOUNTER — Other Ambulatory Visit: Payer: Self-pay | Admitting: Family Medicine

## 2013-07-25 DIAGNOSIS — E291 Testicular hypofunction: Secondary | ICD-10-CM

## 2013-07-25 LAB — TESTOSTERONE, FREE, TOTAL, SHBG
Sex Hormone Binding: 33 nmol/L (ref 13–71)
Testosterone, Free: 35.8 pg/mL — ABNORMAL LOW (ref 47.0–244.0)
Testosterone-% Free: 1.9 % (ref 1.6–2.9)
Testosterone: 189 ng/dL — ABNORMAL LOW (ref 300–890)

## 2013-07-25 MED ORDER — TESTOSTERONE 20.25 MG/1.25GM (1.62%) TD GEL: 1.0000 "application " | Freq: Every morning | TRANSDERMAL | Status: DC

## 2013-07-26 ENCOUNTER — Telehealth: Payer: Self-pay

## 2013-07-26 NOTE — Telephone Encounter (Signed)
Unable to find   

## 2013-07-27 NOTE — Telephone Encounter (Signed)
Unable to find the written rx for Androgel so I called it in for pt last night

## 2013-07-28 ENCOUNTER — Telehealth: Payer: Self-pay

## 2013-07-28 NOTE — Telephone Encounter (Signed)
Notified pt I have faxed PA.

## 2013-07-28 NOTE — Telephone Encounter (Signed)
Patient is calling to check the status of his Androgel RX. [facility] [address]   [phone]

## 2013-07-29 NOTE — Telephone Encounter (Signed)
Exp scripts called and completed PA over the phone. PA approved through 07/29/14, case # [id number]. Pt aware and I notified pharm.

## 2013-08-17 ENCOUNTER — Ambulatory Visit (INDEPENDENT_AMBULATORY_CARE_PROVIDER_SITE_OTHER): Payer: BC Managed Care – PPO | Admitting: Cardiovascular Disease

## 2013-08-17 ENCOUNTER — Encounter: Payer: Self-pay | Admitting: Cardiovascular Disease

## 2013-08-17 VITALS — BP 110/80 | HR 64 | Ht 71.0 in | Wt 300.4 lb

## 2013-08-17 DIAGNOSIS — F419 Anxiety disorder, unspecified: Secondary | ICD-10-CM

## 2013-08-17 DIAGNOSIS — I2510 Atherosclerotic heart disease of native coronary artery without angina pectoris: Secondary | ICD-10-CM

## 2013-08-17 DIAGNOSIS — F411 Generalized anxiety disorder: Secondary | ICD-10-CM

## 2013-08-17 MED ORDER — NITROGLYCERIN 0.4 MG/SPRAY TL SOLN: 1.0000 | Status: DC | PRN

## 2013-08-17 MED ORDER — LISINOPRIL 5 MG PO TABS: 5.0000 mg | ORAL_TABLET | Freq: Every day | ORAL | Status: DC

## 2013-08-17 NOTE — Assessment & Plan Note (Signed)
Well controlled.  Continue current medications and low sodium Dash type diet.   [facility]

## 2013-08-17 NOTE — Progress Notes (Signed)
Patient ID: Jeffery FabianJames R Maudlin, male   DOB: 04/22/1971, 43 y.o.   MRN: [id number] 43 yo Rx for circumflex MI in [place] 01/02/12 :Presented with chest pain and Troponin 9. No acute ST elevation. 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 CRF;s HTN and elevated lipids Anginal pain appears to be left arm and jaw. Has had chronic central chest pain for years thought due to anxiety on xanax for years. Since coming home following low carb diet and walking [facility] treadmill at [facility]. No pains. Taking meds but needs scripts  Hospitalized at end of August with paranoia and chest pain. D/C 8/26 with R/O no stress testing done Started on prosac by Lauenstein  Seen again 11/13 with chest pain F/U cath by Dr Lonzo Candyoooper with no CAD 04/14/12  Final Conclusions:  1. Minor nonobstructive CAD as described above  2. Normal left ventricular systolic function.  Recommendations: I do not see any significant obstructive CAD. The patient underwent recent stenting with a bio absorbable stent platform and I am not able to visualize the stent. I suspect it has minimal radiopacity, but I do not see any significant coronary problems.   Discussed need for close f/u with psych for anxiety disorder  Builds houses and very stressful  Weight is up     ROS: Denies fever, malais, weight loss, blurry vision, decreased visual acuity, cough, sputum, SOB, hemoptysis, pleuritic pain, palpitaitons, heartburn, abdominal pain, melena, lower extremity edema, claudication, or rash.  All other systems reviewed and negative  General: Affect appropriate Obese white male  HEENT: normal Neck supple with no adenopathy JVP normal no bruits no thyromegaly Lungs clear with no wheezing and good diaphragmatic motion Heart:  S1/S2 no murmur, no rub, gallop or click PMI normal Abdomen: benighn, BS positve, no tenderness, no AAA no bruit.  No HSM or HJR Distal pulses intact with no bruits No  edema Neuro non-focal Skin warm and dry No muscular weakness   Current Outpatient Prescriptions  Medication Sig Dispense Refill  . ALPRAZolam (XANAX) 0.5 MG tablet Take 1-1.5 tablets (0.5-0.75 mg total) by mouth 3 (three) times daily as needed.  90 tablet  2  . aspirin 81 MG chewable tablet Chew 81 mg by mouth daily.      Marland Kitchen. atorvastatin (LIPITOR) 80 MG tablet TAKE 1 TABLET BY MOUTH EVERY DAY  90 tablet  3  . bisoprolol (ZEBETA) 5 MG tablet 1/2  TAB DAILY  45 tablet  3  . lisinopril (PRINIVIL,ZESTRIL) 5 MG tablet Take 1 tablet (5 mg total) by mouth daily. PATIENT NEEDS OFFICE VISIT FOR ADDITIONAL REFILLS  30 tablet  0  . nitroGLYCERIN (NITROLINGUAL) 0.4 MG/SPRAY spray Place 1 spray under the tongue every 5 (five) minutes as needed for chest pain.  12 g  1  . sertraline (ZOLOFT) 100 MG tablet Take 1 tablet (100 mg total) by mouth daily.  30 tablet  3  . Testosterone (ANDROGEL) 20.25 MG/1.25GM (1.62%) GEL Place 1 application onto the skin every morning.  1.25 g  3   No current facility-administered medications for this visit.    Allergies  Hctz  Electrocardiogram:  NSR rate 60 normal   Assessment and Plan

## 2013-08-17 NOTE — Assessment & Plan Note (Signed)
Stable with no angina and good activity level.  Continue medical Rx Continue asa and beta blocker

## 2013-08-17 NOTE — Patient Instructions (Signed)
Your physician wants you to follow-up in: YEAR WITH DR NISHAN  You will receive a reminder letter in the mail two months in advance. If you don't receive a letter, please call our office to schedule the follow-up appointment.  Your physician recommends that you continue on your current medications as directed. Please refer to the Current Medication list given to you today. 

## 2013-08-17 NOTE — Assessment & Plan Note (Signed)
Did not tolerate zoloft continue anxiolytics  F/u primary

## 2013-08-17 NOTE — Assessment & Plan Note (Signed)
Needs lipid and liver next week especially since on high dose lipitor No myalgias

## 2013-08-22 ENCOUNTER — Other Ambulatory Visit: Payer: Self-pay | Admitting: *Deleted

## 2013-08-22 ENCOUNTER — Telehealth: Payer: Self-pay | Admitting: *Deleted

## 2013-08-22 DIAGNOSIS — Z79899 Other long term (current) drug therapy: Secondary | ICD-10-CM

## 2013-08-22 DIAGNOSIS — E785 Hyperlipidemia, unspecified: Secondary | ICD-10-CM

## 2013-08-22 NOTE — Telephone Encounter (Signed)
LEFT  REMINDER THAT  PT  NEEDS  TO HAVE  FASTING  LIPID LIVER   THIS WEEK  PER  DR NISHAN./CYT

## 2013-08-22 NOTE — Telephone Encounter (Signed)
Message copied by Alois ClicheYORK, Barbarann Kelly E on Mon Aug 22, 2013  2:48 PM ------      Message from: [place], [place] E      Created: Wed Aug 17, 2013  3:59 PM       NEED LIPID LIVER  NEXT  WEEK  CALL PT  TO  REMIND       ------

## 2013-08-24 ENCOUNTER — Other Ambulatory Visit: Payer: BC Managed Care – PPO

## 2013-12-01 ENCOUNTER — Other Ambulatory Visit: Payer: Self-pay | Admitting: Family Medicine

## 2014-02-04 ENCOUNTER — Other Ambulatory Visit: Payer: Self-pay | Admitting: Family Medicine

## 2014-02-07 ENCOUNTER — Other Ambulatory Visit: Payer: Self-pay | Admitting: *Deleted

## 2014-02-07 MED ORDER — ATORVASTATIN CALCIUM 80 MG PO TABS: ORAL_TABLET | ORAL | Status: DC

## 2014-05-02 ENCOUNTER — Other Ambulatory Visit: Payer: Self-pay | Admitting: Cardiovascular Disease

## 2014-06-30 ENCOUNTER — Telehealth: Payer: Self-pay

## 2014-06-30 NOTE — Telephone Encounter (Signed)
Unfortunately, I don't have a solution for this.  I'll have to scout for some coupons

## 2014-06-30 NOTE — Telephone Encounter (Signed)
Pt's Androgel 20.25/1.25 was denied by insurance because:  Coverage is provided in situations where the patient has had two separate pre-treatment serum testosterone measurements, each taken in the morning, and both levels were low, as defined by the normal laboratory reference values. Coverage cannot be authorized at this time.  Dr L what should we do next? He needs to early morning labs, is there an alternative? Please advise.

## 2014-07-02 ENCOUNTER — Other Ambulatory Visit: Payer: Self-pay | Admitting: Physician Assistant

## 2014-07-03 NOTE — Telephone Encounter (Signed)
I called pt, unable to leave voicemail. 

## 2014-07-27 ENCOUNTER — Other Ambulatory Visit: Payer: Self-pay | Admitting: Cardiovascular Disease

## 2014-09-24 ENCOUNTER — Other Ambulatory Visit: Payer: Self-pay | Admitting: Family Medicine

## 2014-11-18 ENCOUNTER — Other Ambulatory Visit: Payer: Self-pay | Admitting: Cardiovascular Disease

## 2014-11-21 NOTE — Telephone Encounter (Signed)
Per note 1.27.16 

## 2014-12-01 NOTE — Telephone Encounter (Signed)
PT NEEDS APPT AND LABS FOR FUTURE  REFILLS./CY

## 2014-12-31 ENCOUNTER — Ambulatory Visit (INDEPENDENT_AMBULATORY_CARE_PROVIDER_SITE_OTHER): Payer: BC Managed Care – PPO | Admitting: Physician Assistant

## 2014-12-31 VITALS — BP 142/88 | HR 94 | Temp 98.7°F | Resp 18 | Wt 320.0 lb

## 2014-12-31 DIAGNOSIS — Z760 Encounter for issue of repeat prescription: Secondary | ICD-10-CM | POA: Diagnosis not present

## 2014-12-31 DIAGNOSIS — M109 Gout, unspecified: Secondary | ICD-10-CM

## 2014-12-31 DIAGNOSIS — M10061 Idiopathic gout, right knee: Secondary | ICD-10-CM | POA: Diagnosis not present

## 2014-12-31 LAB — COMPLETE METABOLIC PANEL WITH GFR
ALBUMIN: 4.5 g/dL (ref 3.6–5.1)
ALK PHOS: 80 U/L (ref 40–115)
ALT: 45 U/L (ref 9–46)
AST: 25 U/L (ref 10–40)
BILIRUBIN TOTAL: 0.3 mg/dL (ref 0.2–1.2)
BUN: 15 mg/dL (ref 7–25)
CHLORIDE: 105 mmol/L (ref 98–110)
CO2: 24 mmol/L (ref 20–31)
CREATININE: 0.94 mg/dL (ref 0.60–1.35)
Calcium: 9.2 mg/dL (ref 8.6–10.3)
Glucose, Bld: 105 mg/dL — ABNORMAL HIGH (ref 65–99)
Potassium: 4.1 mmol/L (ref 3.5–5.3)
Sodium: 139 mmol/L (ref 135–146)
TOTAL PROTEIN: 7.3 g/dL (ref 6.1–8.1)

## 2014-12-31 LAB — CBC WITH DIFFERENTIAL/PLATELET
Basophils Absolute: 0 10*3/uL (ref 0.0–0.1)
Basophils Relative: 0 % (ref 0–1)
EOS PCT: 2 % (ref 0–5)
Eosinophils Absolute: 0.2 10*3/uL (ref 0.0–0.7)
HEMATOCRIT: 41.1 % (ref 39.0–52.0)
Hemoglobin: 14.2 g/dL (ref 13.0–17.0)
Lymphocytes Relative: 25 % (ref 12–46)
Lymphs Abs: 2.1 10*3/uL (ref 0.7–4.0)
MCH: 30.6 pg (ref 26.0–34.0)
MCHC: 34.5 g/dL (ref 30.0–36.0)
MCV: 88.6 fL (ref 78.0–100.0)
MPV: 10 fL (ref 8.6–12.4)
Monocytes Absolute: 0.6 10*3/uL (ref 0.1–1.0)
Monocytes Relative: 7 % (ref 3–12)
NEUTROS PCT: 66 % (ref 43–77)
Neutro Abs: 5.5 10*3/uL (ref 1.7–7.7)
PLATELETS: 259 10*3/uL (ref 150–400)
RBC: 4.64 MIL/uL (ref 4.22–5.81)
RDW: 14 % (ref 11.5–15.5)
WBC: 8.4 10*3/uL (ref 4.0–10.5)

## 2014-12-31 LAB — TESTOSTERONE: Testosterone: 244 ng/dL — ABNORMAL LOW (ref 300–890)

## 2014-12-31 LAB — TSH: TSH: 2.387 u[IU]/mL (ref 0.350–4.500)

## 2014-12-31 LAB — URIC ACID: Uric Acid, Serum: 7.2 mg/dL (ref 4.0–7.8)

## 2014-12-31 MED ORDER — TESTOSTERONE 20.25 MG/ACT (1.62%) TD GEL
TRANSDERMAL | Status: DC
Start: 2014-12-31 — End: 2015-03-10

## 2014-12-31 MED ORDER — ALPRAZOLAM 0.5 MG PO TABS: 0.5000 mg | ORAL_TABLET | Freq: Every evening | ORAL | Status: DC | PRN

## 2014-12-31 MED ORDER — ATORVASTATIN CALCIUM 80 MG PO TABS: 80.0000 mg | ORAL_TABLET | Freq: Every day | ORAL | Status: DC

## 2014-12-31 MED ORDER — HYDROCODONE-ACETAMINOPHEN 5-325 MG PO TABS: 1.0000 | ORAL_TABLET | Freq: Four times a day (QID) | ORAL | Status: DC | PRN

## 2014-12-31 MED ORDER — SERTRALINE HCL 100 MG PO TABS: 150.0000 mg | ORAL_TABLET | Freq: Every day | ORAL | Status: DC

## 2014-12-31 MED ORDER — BISOPROLOL FUMARATE 5 MG PO TABS: 5.0000 mg | ORAL_TABLET | Freq: Every day | ORAL | Status: DC

## 2014-12-31 MED ORDER — INDOMETHACIN 50 MG PO CAPS: 50.0000 mg | ORAL_CAPSULE | Freq: Two times a day (BID) | ORAL | Status: DC

## 2014-12-31 NOTE — Progress Notes (Signed)
12/31/2014 at 9:37 AM  Jeffery Chapman / DOB: 05-26-1971 / MRN: [id number]  The patient has Localized swelling, mass and lump, neck; Gout attack; CAD (coronary artery disease); Mixed hyperlipidemia; Chest pain; Hypertension; and Anxiety on his problem list.  SUBJECTIVE  Chief complaint: Gout and Medication Refill  Patient with a history of gout here for right knee pain that started two days ago.  He reports a long history of gout and has had it "in every joint in his body."  Reports that this episode started this morning with severe burning pain in the right lateral knee.  He has taken indocin with excellent relief of his pain. He has a history of HTN and reports missing his medication from time to time, and reports he did take his medication this morning, but has missed a few doses previously.  He is heading to [place] for a business trip and is running low on his heart Lipitor and bisoprolol. He usually receives these from his heart doctor and has a follow up in place.  He would like a one month supply of these.  He denies SOB and chest pain today.    He complains that his mood continues to be anxious and often frustrated.  He is taking Xanax in the morning along with Zoloft 100 mg qd for his symptoms are reports that this has "taken the edge off" but he feels that his mood could be better.   He would like a refill of his Androgel and Norco.  He takes Norco for severe pain only and is requesting ten 5 mg tablets today.  He is taking Androgel daily without any side effects and reports that the cardiologist is aware that he is taking this.     He  has a past medical history of Hyperlipidemia; Hypertension; Anxiety; Depression; Gout; and CAD (coronary artery disease).    Medications reviewed and updated by myself where necessary, and exist elsewhere in the encounter.   Mr. Behrend is allergic to hctz. He  reports that he quit smoking about 17 years ago. He quit smokeless tobacco use about 2 years  ago. He reports that he does not drink alcohol or use illicit drugs. He  reports that he currently engages in sexual activity. The patient  has past surgical history that includes Nasal septum surgery (2006) and Tympanostomy tube placement (1976, 1981).  His family history includes Alcohol abuse in his father; Cancer in his paternal aunt and paternal grandmother; Heart disease in his father; Mental illness in his paternal grandmother.  Review of Systems  Constitutional: Negative for fever.  Respiratory: Negative for cough and shortness of breath.   Cardiovascular: Negative for chest pain.  Gastrointestinal: Negative for nausea.  Genitourinary: Negative for dysuria.  Musculoskeletal: Positive for joint pain. Negative for myalgias, back pain, falls and neck pain.  Skin: Negative for rash.  Neurological: Negative for dizziness and headaches.  Psychiatric/Behavioral: Negative for depression.    OBJECTIVE  His  weight is 320 lb (145.151 kg). His oral temperature is 98.7 F (37.1 C). His blood pressure is 142/88 and his pulse is 94. His respiration is 18 and oxygen saturation is 96%.  The patient's body mass index is 44.65 kg/(m^2).  Physical Exam  Vitals reviewed. Constitutional: He is oriented to person, place, and time. He appears well-developed and well-nourished.  HENT:  Head: Normocephalic.  Neck: Normal range of motion.  Cardiovascular: Normal rate and regular rhythm.   Respiratory: Effort normal and breath sounds normal.  GI: Soft. Bowel sounds are normal.  Musculoskeletal:       Right knee: He exhibits decreased range of motion and swelling. He exhibits no effusion, no deformity and no erythema. Tenderness found. Lateral joint line tenderness noted. No MCL, no LCL and no patellar tendon tenderness noted.  Neurological: He is alert and oriented to person, place, and time.  Skin: Skin is warm and dry.  Psychiatric: He has a normal mood and affect.    No results found for this or  any previous visit (from the past 24 hour(s)).  ASSESSMENT & PLAN  [name] was seen today for gout and medication refill.  Diagnoses and all orders for this visit:  Acute gout of right knee, unspecified cause: Patient advised that his BP was slightly high today and advised him to take his medication without fail while taking indocin.    Orders: -     indomethacin (INDOCIN) 50 MG capsule; Take 1 capsule (50 mg total) by mouth 2 (two) times daily with a meal. -     Uric Acid  Encounter for medication refill: Providing refills. Increasing patient's Zoloft dose to 150 qam in the hopes that his mood will improve and he will need less Xanax to control his symptoms.  Patient advised to follow up with Cards for further refills of HTN medication.   Orders: -     ALPRAZolam (XANAX) 0.5 MG tablet; Take 1-2 tablets (0.5-1 mg total) by mouth at bedtime as needed for anxiety. -     atorvastatin (LIPITOR) 80 MG tablet; Take 1 tablet (80 mg total) by mouth daily. -     bisoprolol (ZEBETA) 5 MG tablet; Take 1 tablet (5 mg total) by mouth daily. -     HYDROcodone-acetaminophen (NORCO) 5-325 MG per tablet; Take 1 tablet by mouth every 6 (six) hours as needed for severe pain. -     sertraline (ZOLOFT) 100 MG tablet; Take 1.5 tablets (150 mg total) by mouth daily. PATIENT NEEDS OFFICE VISIT FOR ADDITIONAL REFILLS -     Testosterone (ANDROGEL PUMP) 20.25 MG/ACT (1.62%) GEL; PLACE 1 APPLIACTION ON SKIN EVERY MORNING -     COMPLETE METABOLIC PANEL WITH GFR -     CBC with Differential/Platelet -     TSH -     Testosterone    The patient was advised to call or come back to clinic if he does not see an improvement in symptoms, or worsens with the above plan.   Deliah Boston, MHS, PA-C [facility] 12/31/2014 9:37 AM

## 2015-01-02 ENCOUNTER — Telehealth: Payer: Self-pay

## 2015-01-02 ENCOUNTER — Encounter: Payer: Self-pay | Admitting: Family Medicine

## 2015-01-02 NOTE — Telephone Encounter (Signed)
PA completed on covermymeds for Androgel. PA approved through 01/02/16, case # [id number]. Notified pharm.

## 2015-01-06 NOTE — Progress Notes (Signed)
  Medical screening examination/treatment/procedure(s) were performed by non-physician practitioner and as supervising physician I was immediately available for consultation/collaboration.     

## 2015-01-23 NOTE — Progress Notes (Signed)
Cardiology Office Note   Date:  01/24/2015   ID:  MAGNUM LUNDE, DOB [birth year], MRN [id number]  PCP:  Elvina Sidle, MD  Cardiologist:  Dr. Eden Emms    Chief Complaint  Patient presents with  . Chest Pain      History of Present Illness: Jeffery Chapman is a 44 y.o. male who presents for CAD and refill on meds.  He has a hx. of circumflex MI in [place] 01/02/12 :Presented with chest pain and Troponin 9. No acute ST elevation. 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 CRF;s HTN and elevated lipids Anginal pain appears to be left arm and jaw. Has had chronic central chest pain for years thought due to anxiety on xanax for years.  Last cath 03/2012 with minor non obstructive disease.  EF was 55%.  Today:no complaints, only chest pain related to anxiety taking xanax and it is quickly gone.  He recently waas on a cruise and walked a lot but he also gained wt.  Plans to resume exercise and eating healthy diet, paleo diet.  No recent lipid panel.  Labs from PCP were normal..    Past Medical History  Diagnosis Date  . Hyperlipidemia   . Hypertension   . Anxiety   . Depression   . Gout   . CAD (coronary artery disease)     12/2011- PCI in [place]; s/p nondegradable stent to LAD; nonobstructive residual LAD disease    Past Surgical History  Procedure Laterality Date  . Nasal septum surgery  2006  . Tympanostomy tube placement  1976, 1981     Current Outpatient Prescriptions  Medication Sig Dispense Refill  . ALPRAZolam (XANAX) 0.5 MG tablet Take 1-2 tablets (0.5-1 mg total) by mouth at bedtime as needed for anxiety. 60 tablet 0  . aspirin 81 MG chewable tablet Chew 81 mg by mouth daily.    Marland Kitchen atorvastatin (LIPITOR) 80 MG tablet Take 1 tablet (80 mg total) by mouth daily. 30 tablet 0  . bisoprolol (ZEBETA) 5 MG tablet Take 1 tablet (5 mg total) by mouth daily. 15 tablet 0  . HYDROcodone-acetaminophen (NORCO) 5-325 MG per  tablet Take 1 tablet by mouth every 6 (six) hours as needed for severe pain. 10 tablet 0  . indomethacin (INDOCIN) 50 MG capsule TAKE 1 CAPSULE (50 MG TOTAL) BY MOUTH 2 (TWO) TIMES DAILY WITH A MEAL.  0  . lisinopril (PRINIVIL,ZESTRIL) 5 MG tablet Take 1 tablet (5 mg total) by mouth daily. 30 tablet 11  . nitroGLYCERIN (NITROLINGUAL) 0.4 MG/SPRAY spray Place 1 spray under the tongue every 5 (five) minutes as needed for chest pain. 12 g 1  . sertraline (ZOLOFT) 100 MG tablet Take 1.5 tablets (150 mg total) by mouth daily. PATIENT NEEDS OFFICE VISIT FOR ADDITIONAL REFILLS 45 tablet 11  . Testosterone (ANDROGEL PUMP) 20.25 MG/ACT (1.62%) GEL PLACE 1 APPLIACTION ON SKIN EVERY MORNING 75 g 2   No current facility-administered medications for this visit.    Allergies:   Hctz    Social History:  The patient  reports that he quit smoking about 17 years ago. He quit smokeless tobacco use about 3 years ago. He reports that he does not drink alcohol or use illicit drugs.   Family History:  The patient's family history includes Alcohol abuse in his father; Cancer in his paternal aunt and paternal grandmother; Heart attack in his father and paternal grandfather; Heart disease in his father; Hypertension  in his father; Mental illness in his paternal grandmother. There is no history of Stroke.    ROS:  General:no colds or fevers, no weight changes Skin:no rashes or ulcers HEENT:no blurred vision, no congestion CV:see HPI PUL:see HPI GI:no diarrhea constipation or melena, no indigestion GU:no hematuria, no dysuria MS:no joint pain, no claudication, occ gout Neuro:no syncope, no lightheadedness Endo:no diabetes, no thyroid disease  Wt Readings from Last 3 Encounters:  01/24/15 320 lb (145.151 kg)  12/31/14 320 lb (145.151 kg)  08/17/13 300 lb 6.4 oz (136.261 kg)     PHYSICAL EXAM: VS:  BP 130/81 mmHg  Pulse 62  Ht  (1.803 m)  Wt 320 lb (145.151 kg)  BMI 44.65 kg/m2 , BMI Body mass index  is 44.65 kg/(m^2). General:Pleasant affect, NAD Skin:Warm and dry, brisk capillary refill HEENT:normocephalic, sclera clear, mucus membranes moist Neck:supple, no JVD, no bruits  Heart:S1S2 RRR without murmur, gallup, rub or click Lungs:clear without rales, rhonchi, or wheezes BJY:NWGNF, soft, non tender, + BS, do not palpate liver spleen or masses Ext:no lower ext edema, 2+ pedal pulses, 2+ radial pulses Neuro:alert and oriented X 3, MAE, follows commands, + facial symmetry    EKG:  EKG is ordered today. The ekg ordered today demonstrates SR no changes from previous.    Recent Labs: 12/31/2014: ALT 45; BUN 15; Creat 0.94; Hemoglobin 14.2; Platelets 259; Potassium 4.1; Sodium 139; TSH 2.387    Lipid Panel No results found for: CHOL, TRIG, HDL, CHOLHDL, VLDL, LDLCALC, LDLDIRECT     Other studies Reviewed: Additional studies/ records that were reviewed today include: previous cath 2013..   ASSESSMENT AND PLAN:  1.  CAD with hx biodegradeable stent to LCX in [place].  Now on cath 2013 with non obstructive disease. No recurrent cardiac pain, does have chest pain resolves with xanax.  2. HTN stable  3. Mixed hyperlipidemia on lipitor 80 mg will check lipid panel today.i  4. Anxiety controled with PRN xanax.  5. Occ. gout Follow up with Dr Eden Emms in 1 year.  Current medicines are reviewed with the patient today.  The patient Has no concerns regarding medicines.  The following changes have been made:  See above Labs/ tests ordered today include:see above  Disposition:   FU:  see above  Signed, Leone Brand, NP  01/24/2015 8:25 AM    [facility] [address] [place], [place], [place]  [address]/ [address] [place], [place] Phone: [phone]; Fax: [phone]  [phone]

## 2015-01-24 ENCOUNTER — Ambulatory Visit (INDEPENDENT_AMBULATORY_CARE_PROVIDER_SITE_OTHER): Payer: BC Managed Care – PPO | Admitting: Cardiology

## 2015-01-24 ENCOUNTER — Encounter: Payer: Self-pay | Admitting: Cardiology

## 2015-01-24 VITALS — BP 130/81 | HR 62 | Ht 71.0 in | Wt 320.0 lb

## 2015-01-24 DIAGNOSIS — I2510 Atherosclerotic heart disease of native coronary artery without angina pectoris: Secondary | ICD-10-CM

## 2015-01-24 DIAGNOSIS — Z760 Encounter for issue of repeat prescription: Secondary | ICD-10-CM | POA: Diagnosis not present

## 2015-01-24 DIAGNOSIS — E785 Hyperlipidemia, unspecified: Secondary | ICD-10-CM

## 2015-01-24 LAB — LIPID PANEL
CHOL/HDL RATIO: 4
Cholesterol: 152 mg/dL (ref 0–200)
HDL: 41.2 mg/dL (ref >39.00)
LDL CALC: 92 mg/dL (ref 0–99)
NonHDL: 110.59
TRIGLYCERIDES: 94 mg/dL (ref 0.0–149.0)
VLDL: 18.8 mg/dL (ref 0.0–40.0)

## 2015-01-24 MED ORDER — NITROGLYCERIN 0.4 MG/SPRAY TL SOLN: 1.0000 | Status: DC | PRN

## 2015-01-24 MED ORDER — BISOPROLOL FUMARATE 5 MG PO TABS: 5.0000 mg | ORAL_TABLET | Freq: Every day | ORAL | Status: DC

## 2015-01-24 MED ORDER — LISINOPRIL 5 MG PO TABS: 5.0000 mg | ORAL_TABLET | Freq: Every day | ORAL | Status: DC

## 2015-01-24 MED ORDER — ATORVASTATIN CALCIUM 80 MG PO TABS: 80.0000 mg | ORAL_TABLET | Freq: Every day | ORAL | Status: DC

## 2015-01-24 NOTE — Patient Instructions (Signed)
Medication Instructions:  Your physician recommends that you continue on your current medications as directed. Please refer to the Current Medication list given to you today.   Labwork: Lab work to be done today--Lipid profile  Testing/Procedures: none  Follow-Up: Your physician wants you to follow-up in: 12 months with Dr. Eden Emms. You will receive a reminder letter in the mail two months in advance. If you don't receive a letter, please call our office to schedule the follow-up appointment.   Your physician discussed the importance of regular exercise and recommended that you start or continue a regular exercise program for good health.  Also watch diet closely.

## 2015-03-10 ENCOUNTER — Ambulatory Visit (INDEPENDENT_AMBULATORY_CARE_PROVIDER_SITE_OTHER): Payer: BC Managed Care – PPO | Admitting: Family Medicine

## 2015-03-10 VITALS — BP 120/80 | HR 68 | Temp 98.3°F | Resp 18 | Ht 70.0 in | Wt 333.4 lb

## 2015-03-10 DIAGNOSIS — F419 Anxiety disorder, unspecified: Secondary | ICD-10-CM

## 2015-03-10 DIAGNOSIS — E782 Mixed hyperlipidemia: Secondary | ICD-10-CM | POA: Diagnosis not present

## 2015-03-10 DIAGNOSIS — Z760 Encounter for issue of repeat prescription: Secondary | ICD-10-CM | POA: Diagnosis not present

## 2015-03-10 MED ORDER — ALPRAZOLAM 1 MG PO TABS
1.0000 mg | ORAL_TABLET | Freq: Two times a day (BID) | ORAL | Status: DC | PRN
Start: 2015-03-10 — End: 2016-06-14

## 2015-03-10 MED ORDER — TESTOSTERONE 20.25 MG/ACT (1.62%) TD GEL: TRANSDERMAL | Status: DC

## 2015-03-10 MED ORDER — ATORVASTATIN CALCIUM 80 MG PO TABS: 80.0000 mg | ORAL_TABLET | Freq: Every day | ORAL | Status: DC

## 2015-03-10 NOTE — Progress Notes (Signed)
 @  This chart was scribed for Elvina Sidle, MD by Andrew Au, ED Scribe. This patient was seen in room 4 and the patient's care was started at 8:55 AM.  Patient ID: Jeffery Chapman MRN: [id number], DOB: 17-Mar-1971, 44 y.o. Date of Encounter: 03/10/2015, 8:55 AM  Primary Physician: Elvina Sidle, MD  Chief Complaint: Medication refill  Chief Complaint  Patient presents with   Medication Refill   Depression    per triage screening    HPI: 44 y.o. year old male with history below presents for refill of Lipitor and xanax.  Doing well without issues or complaints. Taking medication daily without adverse effects.   Pt takes .75 of xanax in the morning. He will occasionally take 1 at night before bed about once a week.   He has hx of circumflex MI in [place], states he had a biodegradable stent. He was recently seen by cardiology. States everything is stable. He plans to restart exercising and dieting.   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 States this will be the best year for business as far as number but very stressful. He reports trouble sleeping due to stress about business. He denies being depressed.   Past Medical History  Diagnosis Date   Hyperlipidemia    Hypertension    Anxiety    Depression    Gout    CAD (coronary artery disease)     12/2011- PCI in [place]; s/p nondegradable stent to LAD; nonobstructive residual LAD disease     Home Meds: Prior to Admission medications   Medication Sig Start Date End Date Taking? Authorizing Provider  ALPRAZolam Prudy Feeler) 0.5 MG tablet Take 1-2 tablets (0.5-1 mg total) by mouth at bedtime as needed for anxiety. 12/31/14  Yes Ofilia Neas, PA-C  aspirin 81 MG chewable tablet Chew 81 mg by mouth daily.   Yes Historical Provider, MD  atorvastatin (LIPITOR) 80 MG tablet Take 1 tablet (80 mg total) by mouth daily. 01/24/15  Yes Leone Brand, NP  bisoprolol (ZEBETA) 5 MG tablet Take 1  tablet (5 mg total) by mouth daily. 01/24/15  Yes Leone Brand, NP  HYDROcodone-acetaminophen (NORCO) 5-325 MG per tablet Take 1 tablet by mouth every 6 (six) hours as needed for severe pain. 12/31/14  Yes Ofilia Neas, PA-C  indomethacin (INDOCIN) 50 MG capsule TAKE 1 CAPSULE (50 MG TOTAL) BY MOUTH 2 (TWO) TIMES DAILY WITH A MEAL. 12/31/14  Yes Historical Provider, MD  lisinopril (PRINIVIL,ZESTRIL) 5 MG tablet Take 1 tablet (5 mg total) by mouth daily. 01/24/15  Yes Leone Brand, NP  nitroGLYCERIN (NITROLINGUAL) 0.4 MG/SPRAY spray Place 1 spray under the tongue every 5 (five) minutes as needed for chest pain. 01/24/15 08/27/16 Yes Leone Brand, NP  sertraline (ZOLOFT) 100 MG tablet Take 1.5 tablets (150 mg total) by mouth daily. PATIENT NEEDS OFFICE VISIT FOR ADDITIONAL REFILLS 12/31/14  Yes Ofilia Neas, PA-C  Testosterone (ANDROGEL PUMP) 20.25 MG/ACT (1.62%) GEL PLACE 1 APPLIACTION ON SKIN EVERY MORNING Patient not taking: Reported on 03/10/2015 12/31/14   Ofilia Neas, PA-C    Allergies:  Allergies  Allergen Reactions   Hctz [Hydrochlorothiazide]     aggravates gout    Social History   Social History   Marital Status: Married    Spouse Name: N/A   Number of Children: N/A   Years of Education: N/A   Occupational History   Contractor    Social History Main Topics  Smoking status: Former Smoker    Quit date: 03/08/1997   Smokeless tobacco: Former NeurosurgeonUser    Quit date: 01/02/2012     Comment: quit- 2000   Alcohol Use: No     Comment: not since stent in Aug 2013, occasional 1 every 1-2 months   Drug Use: No   Sexual Activity: Yes   Other Topics Concern   Not on file   Social History Narrative     Review of Systems: Constitutional: negative for chills, fever, night sweats, weight changes, or fatigue  HEENT: negative for vision changes, hearing loss, congestion, rhinorrhea, ST, epistaxis, or sinus pressure Cardiovascular: negative for chest pain or  palpitations Respiratory: negative for hemoptysis, wheezing, shortness of breath, or cough Abdominal: negative for abdominal pain, nausea, vomiting, diarrhea, or constipation Dermatological: negative for rash Neurologic: negative for headache, dizziness, or syncope All other systems reviewed and are otherwise negative with the exception to those above and in the HPI.   Physical Exam: Blood pressure 120/80, pulse 68, temperature 98.3 F (36.8 C), temperature source Oral, resp. rate 18, height 5\' 10"  (1.778 m), weight 333 lb 6 oz (151.218 kg), SpO2 96 %., Body mass index is 47.83 kg/(m^2). General: Well developed, well nourished, in no acute distress. Head: Normocephalic, atraumatic, eyes without discharge, sclera non-icteric, nares are without discharge. Bilateral auditory canals clear, TM's are without perforation, pearly grey and translucent with reflective cone of light bilaterally. Oral cavity moist, posterior pharynx without exudate, erythema, peritonsillar abscess, or post nasal drip.  Neck: Supple. No thyromegaly. Full ROM. No lymphadenopathy. Lungs: Clear bilaterally to auscultation without wheezes, rales, or rhonchi. Breathing is unlabored. Heart: RRR with S1 S2. No murmurs, rubs, or gallops appreciated. Abdomen: Soft, non-tender, non-distended with normoactive bowel sounds. No hepatosplenomegalymegaly. No rebound/guarding. No obvious abdominal masses. Msk:  Strength and tone normal for age. Extremities/Skin: Warm and dry. No clubbing or cyanosis. No edema. No rashes or suspicious lesions. Neuro: Alert and oriented X 3. Moves all extremities spontaneously. Gait is normal. CNII-XII grossly in tact. Psych:  Responds to questions appropriately with a normal affect.  We discussed his stress and he denies depression    ASSESSMENT AND PLAN:  44 y.o. year old male with hyperlipidemia and significant stress, here for medication refill. This chart was scribed in my presence and reviewed by me  personally.    ICD-9-CM ICD-10-CM   1. Encounter for medication refill V68.1 Z76.0 atorvastatin (LIPITOR) 80 MG tablet     Testosterone (ANDROGEL PUMP) 20.25 MG/ACT (1.62%) GEL      By signing my name below, I, Raven Small, attest that this documentation has been prepared under the direction and in the presence of [name], MD.  Electronically Signed: Andrew Auaven Small, ED Scribe. 03/10/2015. 9:14 AM.  Signed, [name], MD 03/10/2015 8:55 AM

## 2016-02-14 ENCOUNTER — Other Ambulatory Visit: Payer: Self-pay | Admitting: Cardiology

## 2016-02-14 DIAGNOSIS — Z760 Encounter for issue of repeat prescription: Secondary | ICD-10-CM

## 2016-06-14 ENCOUNTER — Ambulatory Visit (INDEPENDENT_AMBULATORY_CARE_PROVIDER_SITE_OTHER): Payer: BC Managed Care – PPO | Admitting: Physician Assistant

## 2016-06-14 VITALS — BP 136/84 | HR 84 | Temp 98.3°F | Resp 17 | Ht 71.0 in | Wt 350.0 lb

## 2016-06-14 DIAGNOSIS — I10 Essential (primary) hypertension: Secondary | ICD-10-CM | POA: Diagnosis not present

## 2016-06-14 DIAGNOSIS — F419 Anxiety disorder, unspecified: Secondary | ICD-10-CM

## 2016-06-14 DIAGNOSIS — Z760 Encounter for issue of repeat prescription: Secondary | ICD-10-CM | POA: Diagnosis not present

## 2016-06-14 DIAGNOSIS — G4730 Sleep apnea, unspecified: Secondary | ICD-10-CM | POA: Insufficient documentation

## 2016-06-14 DIAGNOSIS — E291 Testicular hypofunction: Secondary | ICD-10-CM

## 2016-06-14 DIAGNOSIS — Z131 Encounter for screening for diabetes mellitus: Secondary | ICD-10-CM

## 2016-06-14 DIAGNOSIS — I25119 Atherosclerotic heart disease of native coronary artery with unspecified angina pectoris: Secondary | ICD-10-CM | POA: Diagnosis not present

## 2016-06-14 DIAGNOSIS — E782 Mixed hyperlipidemia: Secondary | ICD-10-CM | POA: Diagnosis not present

## 2016-06-14 DIAGNOSIS — M109 Gout, unspecified: Secondary | ICD-10-CM | POA: Diagnosis not present

## 2016-06-14 MED ORDER — ATORVASTATIN CALCIUM 80 MG PO TABS: 80.0000 mg | ORAL_TABLET | Freq: Every day | ORAL | 0 refills | Status: DC

## 2016-06-14 MED ORDER — LISINOPRIL 5 MG PO TABS: 5.0000 mg | ORAL_TABLET | Freq: Every day | ORAL | 0 refills | Status: DC

## 2016-06-14 MED ORDER — ALPRAZOLAM 1 MG PO TABS: 1.0000 mg | ORAL_TABLET | Freq: Every day | ORAL | 0 refills | Status: DC

## 2016-06-14 MED ORDER — SERTRALINE HCL 100 MG PO TABS: 150.0000 mg | ORAL_TABLET | Freq: Every day | ORAL | 0 refills | Status: DC

## 2016-06-14 MED ORDER — HYDROCODONE-ACETAMINOPHEN 5-325 MG PO TABS
1.0000 | ORAL_TABLET | Freq: Four times a day (QID) | ORAL | 0 refills | Status: DC | PRN
Start: 2016-06-14 — End: 2017-09-11

## 2016-06-14 MED ORDER — BISOPROLOL FUMARATE 5 MG PO TABS: 5.0000 mg | ORAL_TABLET | Freq: Every day | ORAL | 0 refills | Status: DC

## 2016-06-14 MED ORDER — INDOMETHACIN 50 MG PO CAPS: ORAL_CAPSULE | ORAL | 0 refills | Status: DC

## 2016-06-14 NOTE — Progress Notes (Signed)
Jeffery Chapman  MRN: [id number] DOB: [birth year]  Subjective:  Pt presents to clinic for medication refills.  His PCP has retired and he has not found a new PCP.  HTN - out of medications for about a month - feels fine - does not check his BP at home  Testosterone - would like to go on this medication - no energy and continued weight gain as well has erection problems - he stopped this medication due to insurance issues but he would like to get back on it  Anxiety - takes zoloft 147m daily - also uses Xanax daily - takes it in the am though it makes him sleepy it makes him clear headed and he really feels like that is what keeps his BP the most controlled - he will sometimes take a 2nd dose in the evening on really stressful days - does not want to change the xanax "it has worked for years and keeps my head clear"  Gout - seems to be about 1 attack a year - [place] - he is traveling there next week on a trip and he wants to make sure he has the medication   Has an appt with his cardiologist who typically treats his cholesterol and HTN next month but he would like refills on those today.  Sleep apnea - has CPAP does not use  Starting to look into weight loss surgery - planning to have a gastric sleeve  Review of Systems  Respiratory: Negative for cough and shortness of breath.   Cardiovascular: Negative for chest pain, palpitations and leg swelling.  Psychiatric/Behavioral: The patient is nervous/anxious.     Patient Active Problem List   Diagnosis Date Noted  . Sleep apnea 06/14/2016  . Chest pain 01/19/2012  . Hypertension 01/19/2012  . Anxiety 01/19/2012  . Mixed hyperlipidemia 01/14/2012  . Gout attack 01/09/2012  . CAD (coronary artery disease) 01/09/2012  . Localized swelling, mass and lump, neck 01/28/2011    Current Outpatient Prescriptions on File Prior to Visit  Medication Sig Dispense Refill  . aspirin 81  MG chewable tablet Chew 81 mg by mouth daily.    . nitroGLYCERIN (NITROLINGUAL) 0.4 MG/SPRAY spray Place 1 spray under the tongue every 5 (five) minutes as needed for chest pain. 12 g 6  . Testosterone (ANDROGEL PUMP) 20.25 MG/ACT (1.62%) GEL PLACE 1 APPLIACTION ON SKIN EVERY MORNING 75 g 5   No current facility-administered medications on file prior to visit.     Allergies  Allergen Reactions  . Hctz [Hydrochlorothiazide]     aggravates gout    Pt patients past, family and social history were reviewed and updated.   Objective:  BP 136/84 (BP Location: Right Arm, Patient Position: Sitting, Cuff Size: Large)   Pulse 84   Temp 98.3 F (36.8 C) (Oral)   Resp 17   Ht 5' 11"  (1.803 m)   Wt (!) 350 lb (158.8 kg)   SpO2 96%   BMI 48.82 kg/m   Physical Exam  Constitutional: He is oriented to person, place, and time and well-developed, well-nourished, and in no distress.  HENT:  Head: Normocephalic and atraumatic.  Right Ear: External ear normal.  Left Ear: External ear normal.  Eyes: Conjunctivae are normal.  Neck: Normal range of motion.  Cardiovascular: Normal rate, regular rhythm, normal heart sounds and intact distal pulses.   Pulmonary/Chest: Effort normal and breath sounds normal. He  has no wheezes.  Genitourinary:  Genitourinary Comments: Pt declined prostate exam  Musculoskeletal:       Right lower leg: He exhibits no edema.       Left lower leg: He exhibits no edema.  Neurological: He is alert and oriented to person, place, and time. Gait normal.  Skin: Skin is warm and dry.  Psychiatric: Mood, memory, affect and judgment normal.  Mild anxious in room    Assessment and Plan :  Coronary artery disease involving native heart with angina pectoris, unspecified vessel or lesion type ([place])  Encounter for medication refill  Sleep apnea, unspecified type - pt needs to have an appt with ENT who has tested and treated his sleep apnea - likely needs to use CPAP again but  last testing was >10 years ago and his CPAP is not currently being used - liekyl part of his low energy problems  Acute gout, unspecified cause, unspecified site - Plan: indomethacin (INDOCIN) 50 MG capsule, HYDROcodone-acetaminophen (NORCO) 5-325 MG tablet - pt has always gotten from his past PCP and kept it on hand - I will write for a small dose to help with his pain during that time but he needs to really use the indocin for the treatment  Mixed hyperlipidemia - Plan: CMP14+EGFR, Lipid panel, atorvastatin (LIPITOR) 80 MG tablet, bisoprolol (ZEBETA) 5 MG tablet - refilled medications for 3 months  Anxiety - Plan: Ambulatory referral to Psychiatry, sertraline (ZOLOFT) 100 MG tablet, ALPRAZolam (XANAX) 1 MG tablet - pt will increase zoloft to 140m as zoloft as it is indicated in his chart that he needs - he feels like the zoloft is doing nothing for his anxiety and that the xanax is what really helps him - d/w pt that treatment of anxiety with daily xanax is not something that I am willing to continue doing and pt would like a referral to psychiatry so he can hopefully continue this treatment that has worked for years  Hypogonadism in male - Plan: PSA, TestT+TestF+SHBG - check labs and treat as indicated which will be a referral to endo to help the patient get the most out of his treatment regimen  Essential hypertension - Plan:, lisinopril (PRINIVIL,ZESTRIL) 5 MG tablet  Screening for diabetes mellitus - Plan: Hemoglobin A1c  PT will RTC fasting for am labs in the next week.  Dw pt that he should pick a new PCP to allow for continuity care.  SWindell HummingbirdPA-C  [facility] 06/15/2016 1:46 AM

## 2016-06-14 NOTE — Patient Instructions (Signed)
     IF you received an x-ray today, you will receive an invoice from [facility]. Please contact [facility] at [phone] with questions or concerns regarding your invoice.   IF you received labwork today, you will receive an invoice from [facility]. Please contact [facility] at [phone] with questions or concerns regarding your invoice.   Our billing staff will not be able to assist you with questions regarding bills from these companies.  You will be contacted with the lab results as soon as they are available. The fastest way to get your results is to activate your My Chart account. Instructions are located on the last page of this paperwork. If you have not heard from us regarding the results in 2 weeks, please contact this office.     

## 2016-06-17 ENCOUNTER — Other Ambulatory Visit: Payer: BC Managed Care – PPO

## 2016-06-17 DIAGNOSIS — E782 Mixed hyperlipidemia: Secondary | ICD-10-CM

## 2016-06-17 DIAGNOSIS — Z131 Encounter for screening for diabetes mellitus: Secondary | ICD-10-CM

## 2016-06-17 DIAGNOSIS — E291 Testicular hypofunction: Secondary | ICD-10-CM

## 2016-06-18 NOTE — Progress Notes (Signed)
Cardiology Office Note   Date:  06/26/2016   ID:  JAEVEON ASHLAND, DOB 06-27-1970, MRN [id number]  PCP:  No primary care provider on file.  Cardiologist:  Dr. Eden Emms    Chief Complaint  Patient presents with  . Coronary artery disease involving native coronary artery of       History of Present Illness: Jeffery Chapman is a 46 y.o. male who presents for CAD and refill on meds.  He has a hx. of circumflex MI in [place] 01/02/12 :Presented with chest pain and Troponin 9. No acute ST elevation. 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 CRF;s HTN and elevated lipids Anginal pain appears to be left arm and jaw. Has had chronic central chest pain for years thought due to anxiety on xanax for years.  Last cath 03/2012 with minor non obstructive disease.  EF was 55%.  Today:no complaints, only chest pain related to anxiety taking xanax and it is quickly gone.   He also gained wt.  Plans to resume exercise and eating healthy diet, paleo diet.    Has not been compliant with meds needs refills  [facility]  Lab Results  Component Value Date   LDLCALC 134 (H) 06/17/2016      Past Medical History:  Diagnosis Date  . Anxiety   . CAD (coronary artery disease)    12/2011- PCI in [place]; s/p nondegradable stent to LAD; nonobstructive residual LAD disease  . Depression   . Gout   . Hyperlipidemia   . Hypertension     Past Surgical History:  Procedure Laterality Date  . NASAL SEPTUM SURGERY  2006  . TYMPANOSTOMY TUBE PLACEMENT  1976, 1981     Current Outpatient Prescriptions  Medication Sig Dispense Refill  . ALPRAZolam (XANAX) 1 MG tablet Take 1 tablet (1 mg total) by mouth daily. 30 tablet 0  . aspirin 81 MG chewable tablet Chew 81 mg by mouth daily.    Marland Kitchen atorvastatin (LIPITOR) 80 MG tablet Take 1 tablet (80 mg total) by mouth daily. 90 tablet 0  . bisoprolol (ZEBETA) 5 MG tablet Take 1 tablet (5 mg  total) by mouth daily. 90 tablet 0  . HYDROcodone-acetaminophen (NORCO) 5-325 MG tablet Take 1 tablet by mouth every 6 (six) hours as needed for severe pain. 10 tablet 0  . indomethacin (INDOCIN) 50 MG capsule TAKE 1 CAPSULE (50 MG TOTAL) BY MOUTH 2 (TWO) TIMES DAILY WITH A MEAL. 60 capsule 0  . lisinopril (PRINIVIL,ZESTRIL) 5 MG tablet Take 1 tablet (5 mg total) by mouth daily. 90 tablet 0  . nitroGLYCERIN (NITROLINGUAL) 0.4 MG/SPRAY spray Place 1 spray under the tongue every 5 (five) minutes as needed for chest pain. 12 g 6  . sertraline (ZOLOFT) 100 MG tablet Take 1.5 tablets (150 mg total) by mouth daily. 135 tablet 0  . Testosterone (ANDROGEL PUMP) 20.25 MG/ACT (1.62%) GEL PLACE 1 APPLIACTION ON SKIN EVERY MORNING 75 g 5   No current facility-administered medications for this visit.     Allergies:   Hctz [hydrochlorothiazide]    Social History:  The patient  reports that he quit smoking about 19 years ago. He quit smokeless tobacco use about 4 years ago. He reports that he does not drink alcohol or use drugs.   Family History:  The patient's family history includes Alcohol abuse in his father; Cancer in his paternal aunt and paternal grandmother; Heart attack in  his father and paternal grandfather; Heart disease in his father; Hypertension in his father; Mental illness in his paternal grandmother.    ROS:  General:no colds or fevers, no weight changes Skin:no rashes or ulcers HEENT:no blurred vision, no congestion CV:see HPI PUL:see HPI GI:no diarrhea constipation or melena, no indigestion GU:no hematuria, no dysuria MS:no joint pain, no claudication, occ gout Neuro:no syncope, no lightheadedness Endo:no diabetes, no thyroid disease  Wt Readings from Last 3 Encounters:  06/26/16 (!) 349 lb 3.2 oz (158.4 kg)  06/14/16 (!) 350 lb (158.8 kg)  03/10/15 (!) 333 lb 6 oz (151.2 kg)     PHYSICAL EXAM: VS:  BP 130/90   Pulse 89   Ht 6' (1.829 m)   Wt (!) 349 lb 3.2 oz (158.4 kg)    SpO2 95%   BMI 47.36 kg/m  , BMI Body mass index is 47.36 kg/m. General:Pleasant affect, NAD Skin:Warm and dry, brisk capillary refill HEENT:normocephalic, sclera clear, mucus membranes moist Neck:supple, no JVD, no bruits  Heart:S1S2 RRR without murmur, gallup, rub or click Lungs:clear without rales, rhonchi, or wheezes ZOX:WRUEAAbd:obese, soft, non tender, + BS, do not palpate liver spleen or masses Ext:no lower ext edema, 2+ pedal pulses, 2+ radial pulses Neuro:alert and oriented X 3, MAE, follows commands, + facial symmetry    EKG:  EKG is ordered today. The ekg ordered today demonstrates SR no changes from previous.    Recent Labs: 06/17/2016: ALT 90; BUN 19; Creatinine, Ser 0.82; Potassium 4.3; Sodium 143    Lipid Panel    Component Value Date/Time   CHOL 206 (H) 06/17/2016 0821   TRIG 132 06/17/2016 0821   HDL 46 06/17/2016 0821   CHOLHDL 4.5 06/17/2016 0821   CHOLHDL 4 01/24/2015 0902   VLDL 18.8 01/24/2015 0902   LDLCALC 134 (H) 06/17/2016 [id number]       Other studies Reviewed: Additional studies/ records that were reviewed today include: previous cath 2013..Old notes and labs    ASSESSMENT AND PLAN:  1.  CAD with hx biodegradeable stent to [place] in [place].  Now on cath 2013 with non obstructive disease. No recurrent cardiac pain, does have chest pain resolves with xanax.   2. HTN elevated today not compliant with meds refills called in   3. Mixed hyperlipidemia: above goal but did not take lipitor for a month   4. Anxiety controled with PRN xanax.  5. Obesity: Likely will clear to have bariatric surgery with no testing. He will call us with surgeons name And possible date once evaluation done   Charlton HawsPeter Teasha Murrillo

## 2016-06-20 LAB — PSA: Prostate Specific Ag, Serum: 0.2 ng/mL (ref 0.0–4.0)

## 2016-06-20 LAB — LIPID PANEL
CHOLESTEROL TOTAL: 206 mg/dL — AB (ref 100–199)
Chol/HDL Ratio: 4.5 ratio units (ref 0.0–5.0)
HDL: 46 mg/dL (ref >39)
LDL Calculated: 134 mg/dL — ABNORMAL HIGH (ref 0–99)
TRIGLYCERIDES: 132 mg/dL (ref 0–149)
VLDL Cholesterol Cal: 26 mg/dL (ref 5–40)

## 2016-06-20 LAB — CMP14+EGFR
ALK PHOS: 65 IU/L (ref 39–117)
ALT: 90 IU/L — ABNORMAL HIGH (ref 0–44)
AST: 59 IU/L — AB (ref 0–40)
Albumin/Globulin Ratio: 1.6 (ref 1.2–2.2)
Albumin: 4.3 g/dL (ref 3.5–5.5)
BILIRUBIN TOTAL: 0.3 mg/dL (ref 0.0–1.2)
BUN / CREAT RATIO: 23 — AB (ref 9–20)
BUN: 19 mg/dL (ref 6–24)
CHLORIDE: 106 mmol/L (ref 96–106)
CO2: 21 mmol/L (ref 18–29)
Calcium: 9 mg/dL (ref 8.7–10.2)
Creatinine, Ser: 0.82 mg/dL (ref 0.76–1.27)
GFR calc Af Amer: 123 mL/min/{1.73_m2} (ref >59)
GFR calc non Af Amer: 106 mL/min/{1.73_m2} (ref >59)
GLOBULIN, TOTAL: 2.7 g/dL (ref 1.5–4.5)
GLUCOSE: 108 mg/dL — AB (ref 65–99)
Potassium: 4.3 mmol/L (ref 3.5–5.2)
SODIUM: 143 mmol/L (ref 134–144)
Total Protein: 7 g/dL (ref 6.0–8.5)

## 2016-06-20 LAB — HEMOGLOBIN A1C
Est. average glucose Bld gHb Est-mCnc: 111 mg/dL
HEMOGLOBIN A1C: 5.5 % (ref 4.8–5.6)

## 2016-06-20 LAB — TESTT+TESTF+SHBG
Sex Hormone Binding: 49.3 nmol/L (ref 16.5–55.9)
TESTOSTERONE FREE: 8.9 pg/mL (ref 6.8–21.5)
TESTOSTERONE, TOTAL: 466.1 ng/dL (ref 264.0–916.0)

## 2016-06-26 ENCOUNTER — Ambulatory Visit (INDEPENDENT_AMBULATORY_CARE_PROVIDER_SITE_OTHER): Payer: BC Managed Care – PPO | Admitting: Cardiovascular Disease

## 2016-06-26 ENCOUNTER — Encounter: Payer: Self-pay | Admitting: Cardiovascular Disease

## 2016-06-26 VITALS — BP 130/90 | HR 89 | Ht 72.0 in | Wt 349.2 lb

## 2016-06-26 DIAGNOSIS — E782 Mixed hyperlipidemia: Secondary | ICD-10-CM | POA: Diagnosis not present

## 2016-06-26 DIAGNOSIS — I2510 Atherosclerotic heart disease of native coronary artery without angina pectoris: Secondary | ICD-10-CM | POA: Diagnosis not present

## 2016-06-26 DIAGNOSIS — I10 Essential (primary) hypertension: Secondary | ICD-10-CM | POA: Diagnosis not present

## 2016-06-26 MED ORDER — ATORVASTATIN CALCIUM 80 MG PO TABS: 80.0000 mg | ORAL_TABLET | Freq: Every day | ORAL | 3 refills | Status: DC

## 2016-06-26 MED ORDER — BISOPROLOL FUMARATE 5 MG PO TABS: 5.0000 mg | ORAL_TABLET | Freq: Every day | ORAL | 3 refills | Status: DC

## 2016-06-26 MED ORDER — LISINOPRIL 5 MG PO TABS: 5.0000 mg | ORAL_TABLET | Freq: Every day | ORAL | 3 refills | Status: DC

## 2016-06-26 NOTE — Patient Instructions (Addendum)

## 2016-07-02 ENCOUNTER — Other Ambulatory Visit (HOSPITAL_BASED_OUTPATIENT_CLINIC_OR_DEPARTMENT_OTHER): Payer: Self-pay

## 2016-07-02 DIAGNOSIS — G4733 Obstructive sleep apnea (adult) (pediatric): Secondary | ICD-10-CM

## 2016-07-02 DIAGNOSIS — R0683 Snoring: Secondary | ICD-10-CM

## 2016-08-07 ENCOUNTER — Ambulatory Visit (HOSPITAL_BASED_OUTPATIENT_CLINIC_OR_DEPARTMENT_OTHER): Payer: BC Managed Care – PPO | Attending: Otolaryngology | Admitting: Internal Medicine

## 2016-08-07 VITALS — Ht 71.0 in | Wt 316.0 lb

## 2016-08-07 DIAGNOSIS — G4733 Obstructive sleep apnea (adult) (pediatric): Secondary | ICD-10-CM | POA: Diagnosis not present

## 2016-08-07 DIAGNOSIS — R0683 Snoring: Secondary | ICD-10-CM

## 2016-08-16 DIAGNOSIS — R0683 Snoring: Secondary | ICD-10-CM

## 2016-08-17 NOTE — Procedures (Signed)
  Patient Name: Jeffery Chapman, Artur Study Date: 08/07/2016 Gender: Male D.O.B: Oct 02, 1970 Age (years): 45 Referring Provider: Osborn Cohoavid Shoemaker Height (inches): 71 Interpreting Physician: Jetty Duhamellinton Young MD, ABSM Weight (lbs): 316 RPSGT: Shelah LewandowskyGregory, Ken[place] BMI: 44 MRN: [id number] Neck Size: 20.00 CLINICAL INFORMATION Sleep Study Type: NPSG  Indication for sleep study: Excessive Daytime Sleepiness, Fatigue, Hypertension, Morning Headaches, Obesity, OSA, Snoring, Witnessed Apneas  Epworth Sleepiness Score: 11  SLEEP STUDY TECHNIQUE As per the AASM Manual for the Scoring of Sleep and Associated Events v2.3 (April 2016) with a hypopnea requiring 4% desaturations.  The channels recorded and monitored were frontal, central and occipital EEG, electrooculogram (EOG), submentalis EMG (chin), nasal and oral airflow, thoracic and abdominal wall motion, anterior tibialis EMG, snore microphone, electrocardiogram, and pulse oximetry.  MEDICATIONS Medications self-administered by patient taken the night of the study : XANAX  SLEEP ARCHITECTURE The study was initiated at 9:53:00 PM and ended at 4:40:44 AM.  Sleep onset time was 34.7 minutes and the sleep efficiency was 84.0%. The total sleep time was 342.5 minutes.  Stage REM latency was 204.0 minutes.  The patient spent 4.23% of the night in stage N1 sleep, 85.11% in stage N2 sleep, 0.00% in stage N3 and 10.66% in REM.  Alpha intrusion was absent.  Supine sleep was 35.18%.  RESPIRATORY PARAMETERS The overall apnea/hypopnea index (AHI) was 8.2 per hour. There were 31 total apneas, including 31 obstructive, 0 central and 0 mixed apneas. There were 16 hypopneas and 8 RERAs.  The AHI during Stage REM sleep was 32.9 per hour.  AHI while supine was 2.5 per hour.  The mean oxygen saturation was 91.97%. The minimum SpO2 during sleep was 84.00%.  Moderate snoring was noted during this study.  CARDIAC DATA The 2 lead EKG demonstrated sinus rhythm.  The mean heart rate was 54.84 beats per minute. Other EKG findings include: PVCs.  LEG MOVEMENT DATA The total PLMS were 0 with a resulting PLMS index of 0.00. Associated arousal with leg movement index was 0.0 .  IMPRESSIONS - Mild obstructive sleep apnea occurred during this study (AHI = 8.2/h). - No significant central sleep apnea occurred during this study (CAI = 0.0/h). - Mild oxygen desaturation was noted during this study (Min O2 = 84.00%). - The patient snored with Moderate snoring volume. - EKG findings include PVCs. - Clinically significant periodic limb movements did not occur during sleep. No significant associated arousals.  DIAGNOSIS - Obstructive Sleep Apnea (327.23 [G47.33 ICD-10])  RECOMMENDATIONS - Consider CPAP or a fitted oral appliance. Other interventions based on clinical judgment. - Be careful with alcohol, sedatives and other CNS depressants that may worsen sleep apnea and disrupt normal sleep architecture. - Sleep hygiene should be reviewed to assess factors that may improve sleep quality. - Weight management and regular exercise should be initiated or continued if appropriate.  [Electronically signed] 08/17/2016 09:33 AM  Jetty Duhamellinton Young MD, ABSM Diplomate, American Board of Sleep Medicine   NPI: [id number]  Waymon BudgeYOUNG,CLINTON D Diplomate, American Board of Sleep Medicine  ELECTRONICALLY SIGNED ON:  08/17/2016, 9:33 AM [facility] PH: [phone]   FX: [phone] ACCREDITED BY THE AMERICAN ACADEMY OF SLEEP MEDICINE

## 2016-11-30 ENCOUNTER — Other Ambulatory Visit: Payer: Self-pay | Admitting: Physician Assistant

## 2016-11-30 DIAGNOSIS — F419 Anxiety disorder, unspecified: Secondary | ICD-10-CM

## 2016-11-30 DIAGNOSIS — I10 Essential (primary) hypertension: Secondary | ICD-10-CM

## 2016-12-01 NOTE — Telephone Encounter (Signed)
I have not seen patient since 05/2016 - I believe he was sent to Dr Evelene CroonKaur for treatment.  I have given him a 1 month supply of his zoloft (though I only gave him a 3 month supply in Jan) and a few Xanax - if he is seeing Dr Evelene CroonKaur he needs to follow up with her.  I looked in [place] drug database and he has not had Xanax since I gave to him in Jan.

## 2016-12-01 NOTE — Telephone Encounter (Signed)
Please advise 

## 2016-12-05 ENCOUNTER — Telehealth: Payer: Self-pay

## 2016-12-05 NOTE — Telephone Encounter (Signed)
Called in Rx for xanax.

## 2017-02-01 ENCOUNTER — Other Ambulatory Visit: Payer: Self-pay | Admitting: Physician Assistant

## 2017-02-01 DIAGNOSIS — M109 Gout, unspecified: Secondary | ICD-10-CM

## 2017-07-06 ENCOUNTER — Other Ambulatory Visit: Payer: Self-pay | Admitting: Cardiovascular Disease

## 2017-07-06 DIAGNOSIS — E782 Mixed hyperlipidemia: Secondary | ICD-10-CM

## 2017-07-22 ENCOUNTER — Other Ambulatory Visit: Payer: Self-pay | Admitting: Cardiovascular Disease

## 2017-07-22 DIAGNOSIS — I10 Essential (primary) hypertension: Secondary | ICD-10-CM

## 2017-07-22 DIAGNOSIS — E782 Mixed hyperlipidemia: Secondary | ICD-10-CM

## 2017-07-24 ENCOUNTER — Telehealth: Payer: Self-pay | Admitting: *Deleted

## 2017-07-24 NOTE — Telephone Encounter (Signed)
Received a fax from [facility] requesting an alternative to the bisoprolol as it is on backorder until July. Thanks, MI

## 2017-07-24 NOTE — Telephone Encounter (Signed)
Would recommend change to Toprol XL 25mg  daily.

## 2017-07-27 ENCOUNTER — Other Ambulatory Visit: Payer: Self-pay | Admitting: *Deleted

## 2017-07-27 MED ORDER — METOPROLOL SUCCINATE ER 25 MG PO TB24: 25.0000 mg | ORAL_TABLET | Freq: Every day | ORAL | 0 refills | Status: DC

## 2017-07-27 NOTE — Telephone Encounter (Signed)
Rx sent in, med list updated. Tried to reach patient but was unsuccessful.

## 2017-08-28 ENCOUNTER — Telehealth: Payer: Self-pay | Admitting: Internal Medicine

## 2017-08-28 NOTE — Telephone Encounter (Signed)
Spoke with pt. He is requesting his sleep study results. Advised him that since he is not our pt, he will have to contact the doctor who ordered his sleep study. He agreed and verbalized understanding. Nothing further was needed.

## 2017-09-09 NOTE — Progress Notes (Signed)
Cardiology Office Note   Date:  09/11/2017   ID:  Jeffery Chapman, DOB 24-Jun-1970, MRN [id number]  PCP:  Jeffery Riddle, PA-C  Cardiologist:  Dr. Eden Chapman    No chief complaint on file.     History of Present Illness:  47 y.o. f/u CAD and HTN. Circumflex MI Jeffery [place] August 2013. Had biodegradable stent to OM. Had left arm and jaw pain as anginal equivalent. Had repeat cath 03/2012 with minor non obstructive disease.  Considering bariatric surgery at [place].   Compliant with meds   Has CPAP now and wearing Has lost 30 lbs with Keto type diet Taking daughter on [place] Cruise And then going to [place] latter in [place] and Jeffery [place] motorcycle   Lab Results  Component Value Date   LDLCALC 134 (H) 06/17/2016      Past Medical History:  Diagnosis Date  . Anxiety   . CAD (coronary artery disease)    12/2011- PCI in [place]; s/p nondegradable stent to LAD; nonobstructive residual LAD disease  . Depression   . Gout   . Hyperlipidemia   . Hypertension     Past Surgical History:  Procedure Laterality Date  . NASAL SEPTUM SURGERY  2006  . TYMPANOSTOMY TUBE PLACEMENT  1976, 1981     Current Outpatient Medications  Medication Sig Dispense Refill  . ALPRAZolam (XANAX) 1 MG tablet TAKE 1 TABLET BY MOUTH EVERY DAY 10 tablet 0  . aspirin 81 MG chewable tablet Chew 81 mg by mouth daily.    Marland Kitchen atorvastatin (LIPITOR) 80 MG tablet Take 1 tablet (80 mg total) by mouth daily. Please make overdue yearly appt with Dr. Eden Chapman before anymore refills. 2nd attempt 15 tablet 0  . indomethacin (INDOCIN) 50 MG capsule Take 1 capsule (50 mg total) by mouth 2 (two) times daily as needed. TAKE 1 CAPSULE (50 MG TOTAL) BY MOUTH 2 (TWO) TIMES DAILY WITH A MEAL. 40 capsule 0  . lisinopril (PRINIVIL,ZESTRIL) 5 MG tablet Take 1 tablet (5 mg total) by mouth daily. Please make overdue yearly appt with Dr. Eden Chapman before anymore refills. 2nd attempt 15 tablet 0  . nitroGLYCERIN  (NITROLINGUAL) 0.4 MG/SPRAY spray Place 1 spray under the tongue every 5 (five) minutes as needed for chest pain. 12 g 6  . sertraline (ZOLOFT) 100 MG tablet TAKE 1.5 TABLETS (150 MG TOTAL) BY MOUTH DAILY. 45 tablet 0   No current facility-administered medications for this visit.     Allergies:   Hctz [hydrochlorothiazide]    Social History:  The patient  reports that he quit smoking about 20 years ago. He quit smokeless tobacco use about 5 years ago. He reports that he does not drink alcohol or use drugs.   Family History:  The patient's family history includes Alcohol abuse in his father; Cancer in his paternal aunt and paternal grandmother; Heart attack in his father and paternal grandfather; Heart disease in his father; Hypertension in his father; Mental illness in his paternal grandmother.    ROS:  General:no colds or fevers, no weight changes Skin:no rashes or ulcers HEENT:no blurred vision, no congestion CV:see HPI PUL:see HPI GI:no diarrhea constipation or melena, no indigestion GU:no hematuria, no dysuria MS:no joint pain, no claudication, occ gout Neuro:no syncope, no lightheadedness Endo:no diabetes, no thyroid disease  Wt Readings from Last 3 Encounters:  09/11/17 (!) 340 lb 8 oz (154.4 kg)  08/07/16 (!) 316 lb (143.3 kg)  06/26/16 (!) 349 lb 3.2 oz (158.4 kg)  PHYSICAL EXAM: VS:  BP 138/82   Pulse 81   Ht 5\' 11"  (1.803 m)   Wt (!) 340 lb 8 oz (154.4 kg)   SpO2 94%   BMI 47.49 kg/m  , BMI Body mass index is 47.49 kg/m. Affect appropriate Obese male  HEENT: normal Neck supple with no adenopathy JVP normal no bruits no thyromegaly Lungs clear with no wheezing and good diaphragmatic motion Heart:  S1/S2 no murmur, no rub, gallop or click PMI normal Abdomen: benighn, BS positve, no tenderness, no AAA no bruit.  No HSM or HJR Distal pulses intact with no bruits No edema Neuro non-focal Skin warm and dry No muscular weakness   EKG:   09/11/17 SR normal  rate 69     Recent Labs: No results found for requested labs within last 8760 hours.    Lipid Panel    Component Value Date/Time   CHOL 206 (H) 06/17/2016 0821   TRIG 132 06/17/2016 0821   HDL 46 06/17/2016 0821   CHOLHDL 4.5 06/17/2016 0821   CHOLHDL 4 01/24/2015 0902   VLDL 18.8 01/24/2015 0902   LDLCALC 134 (H) 06/17/2016 [id number]       Other studies Reviewed: Additional studies/ records that were reviewed today include: previous cath 2013..Old notes and labs    ASSESSMENT AND PLAN:  1.  CAD with hx biodegradeable stent to LCX in [place].  Cath 2013 with non obstructive disease. No recurrent cardiac pain, does have chest pain resolves with xanax. ECG normal Clear to have Bariatric surgery   2. HTN Well controlled.  Continue current medications and low sodium Dash type diet.    3. Mixed hyperlipidemia: f/u labs primary continue statin   4. Anxiety controled with PRN xanax.  5. Obesity: Likely will clear to have bariatric surgery with no testing. He will call us with surgeons name And possible date once evaluation done  6. OSA:  Has CPAP and wearing regularly now    Regions Financial CorporationPeter Magin Chapman

## 2017-09-11 ENCOUNTER — Encounter: Payer: Self-pay | Admitting: Cardiovascular Disease

## 2017-09-11 ENCOUNTER — Ambulatory Visit: Payer: BC Managed Care – PPO | Admitting: Cardiovascular Disease

## 2017-09-11 VITALS — BP 138/82 | HR 81 | Ht 71.0 in | Wt 340.5 lb

## 2017-09-11 DIAGNOSIS — I10 Essential (primary) hypertension: Secondary | ICD-10-CM

## 2017-09-11 DIAGNOSIS — I2510 Atherosclerotic heart disease of native coronary artery without angina pectoris: Secondary | ICD-10-CM

## 2017-09-11 DIAGNOSIS — E782 Mixed hyperlipidemia: Secondary | ICD-10-CM | POA: Diagnosis not present

## 2017-09-11 MED ORDER — ATORVASTATIN CALCIUM 80 MG PO TABS: 80.0000 mg | ORAL_TABLET | Freq: Every day | ORAL | 3 refills | Status: DC

## 2017-09-11 MED ORDER — METOPROLOL SUCCINATE ER 25 MG PO TB24: 25.0000 mg | ORAL_TABLET | Freq: Every day | ORAL | 3 refills | Status: DC

## 2017-09-11 MED ORDER — NITROGLYCERIN 0.4 MG SL SUBL: 0.4000 mg | SUBLINGUAL_TABLET | SUBLINGUAL | 3 refills | Status: DC | PRN

## 2017-09-11 MED ORDER — LISINOPRIL 5 MG PO TABS: 5.0000 mg | ORAL_TABLET | Freq: Every day | ORAL | 3 refills | Status: DC

## 2017-09-11 NOTE — Addendum Note (Signed)
Addended by: Virl AxePATE INGALLS, Vonzell Lindblad L on: 09/11/2017 08:56 AM   Modules accepted: Orders

## 2017-09-11 NOTE — Patient Instructions (Addendum)

## 2018-09-07 ENCOUNTER — Telehealth: Payer: Self-pay

## 2018-09-07 NOTE — Telephone Encounter (Signed)
Virtual Visit Pre-Appointment Phone Call  Steps For Call:  1. Confirm consent - "In the setting of the current Covid19 crisis, you are scheduled for a (phone or video) visit with your provider on (date) at (time).  Just as we do with many in-office visits, in order for you to participate in this visit, we must obtain consent.  If you'd like, I can send this to your mychart (if signed up) or email for you to review.  Otherwise, I can obtain your verbal consent now.  All virtual visits are billed to your insurance company just like a normal visit would be.  By agreeing to a virtual visit, we'd like you to understand that the technology does not allow for your provider to perform an examination, and thus may limit your provider's ability to fully assess your condition.  Finally, though the technology is pretty good, we cannot assure that it will always work on either your or our end, and in the setting of a video visit, we may have to convert it to a phone-only visit.  In either situation, we cannot ensure that we have a secure connection.  Are you willing to proceed?" STAFF: Did the patient verbally acknowledge consent to telehealth visit? YES  2. Confirm the BEST phone number to call the day of the visit: [phone]  3. Give patient instructions for WebEx/MyChart download to smartphone as below or Doximity/Doxy.me if video visit (depending on what platform provider is using)  4. Advise patient to be prepared with any vital sign or heart rhythm information, their current medicines, and a piece of paper and pen handy for any instructions they may receive the day of their visit  5. Inform patient they will receive a phone call 15 minutes prior to their appointment time (may be from unknown caller ID) so they should be prepared to answer  6. Confirm that appointment type is correct in Epic appointment notes (video vs telephone)     TELEPHONE CALL NOTE  Jeffery Chapman has been deemed a  candidate for a follow-up tele-health visit to limit community exposure during the Covid-19 pandemic. I spoke with the patient via phone to ensure availability of phone/video source, confirm preferred email & phone number, and discuss instructions and expectations.  I reminded Jeffery Chapman to be prepared with any vital sign and/or heart rhythm information that could potentially be obtained via home monitoring, at the time of his visit. I reminded Jeffery Chapman to expect a phone call at the time of his visit if his visit.  Michaelle Copas, CMA 09/07/2018 1:54 PM   DOWNLOADING THE WEBEX APP TO SMARTPHONE  - If Apple, ask patient to go to [employer] and type in WebEx in the search bar. Download [employer], the blue/green circle. If Android, go to [employer] and type in [employer] in the search bar. The app is free but as with any other app downloads, their phone may require them to verify saved payment information or Apple/Android password.  - The patient does NOT have to create an account. - On the day of the visit, the assist will walk the patient through joining the meeting with the meeting number/password.  DOWNLOADING THE MYCHART APP TO SMARTPHONE  - If Apple, go to [employer] and type in MyChart in the search bar and download the app. If Android, ask patient to go to [employer] and type in [place] in the search bar and download  the app. The app is free but as with any other app downloads, their phone may require them to verify saved payment information or Apple/Android password.  - The patient will need to then log into the app with their MyChart username and password, and select [facility] as their healthcare provider to link the account. When it is time for your visit, go to the MyChart app, find appointments, and click Begin Video Visit. Be sure to Select Allow for your device to access the Microphone and Camera for your visit. You will then be connected, and your provider  will be with you shortly.  **If they have any issues connecting, or need assistance please contact [facility] [phone])**  **If using a computer, in order to ensure the best quality for your visit they will need to use either of the following Internet Browsers: [employer] [place], or Google Chrome**  [place]   I hereby voluntarily request, consent and authorize [facility] and its employed or contracted physicians, physician assistants, nurse practitioners or other licensed health care professionals (the Practitioner), to provide me with telemedicine health care services (the Services") as deemed necessary by the treating Practitioner. I acknowledge and consent to receive the Services by the Practitioner via telemedicine. I understand that the telemedicine visit will involve communicating with the Practitioner through live audiovisual communication technology and the disclosure of certain medical information by electronic transmission. I acknowledge that I have been given the opportunity to request an in-person assessment or other available alternative prior to the telemedicine visit and am voluntarily participating in the telemedicine visit.  I understand that I have the right to withhold or withdraw my consent to the use of telemedicine in the course of my care at any time, without affecting my right to future care or treatment, and that the Practitioner or I may terminate the telemedicine visit at any time. I understand that I have the right to inspect all information obtained and/or recorded in the course of the telemedicine visit and may receive copies of available information for a reasonable fee.  I understand that some of the potential risks of receiving the Services via telemedicine include:   Delay or interruption in medical evaluation due to technological equipment failure or disruption;  Information transmitted may not be  sufficient (e.g. poor resolution of images) to allow for appropriate medical decision making by the Practitioner; and/or   In rare instances, security protocols could fail, causing a breach of personal health information.  Furthermore, I acknowledge that it is my responsibility to provide information about my medical history, conditions and care that is complete and accurate to the best of my ability. I acknowledge that Practitioner's advice, recommendations, and/or decision may be based on factors not within their control, such as incomplete or inaccurate data provided by me or distortions of diagnostic images or specimens that may result from electronic transmissions. I understand that the practice of medicine is not an exact science and that Practitioner makes no warranties or guarantees regarding treatment outcomes. I acknowledge that I will receive a copy of this consent concurrently upon execution via email to the email address I last provided but may also request a printed copy by calling the office of [facility].    I understand that my insurance will be billed for this visit.   I have read or had this consent read to me.  I understand the contents of this consent, which adequately explains the benefits  and risks of the Services being provided via telemedicine.   I have been provided ample opportunity to ask questions regarding this consent and the Services and have had my questions answered to my satisfaction.  I give my informed consent for the services to be provided through the use of telemedicine in my medical care  By participating in this telemedicine visit I agree to the above.

## 2018-09-09 NOTE — Progress Notes (Signed)
Virtual Visit via Video Note   This visit type was conducted due to national recommendations for restrictions regarding the COVID-19 Pandemic (e.g. social distancing) in an effort to limit this patient's exposure and mitigate transmission in our community.  Due to his co-morbid illnesses, this patient is at least at moderate risk for complications without adequate follow up.  This format is felt to be most appropriate for this patient at this time.  All issues noted in this document were discussed and addressed.  A limited physical exam was performed with this format.  Please refer to the patient's chart for his consent to telehealth for [facility].   Evaluation Performed:  Follow-up visit  Date:  09/13/2018   ID:  Jeffery FabianJames R Westerhoff, DOB 11/08/1970, MRN [id number]  Patient Location: Home Provider Location: Office  PCP:  Patient, No Pcp Per  Cardiologist:  Eden EmmsNishan Electrophysiologist:  None   Chief Complaint:  CAD  History of Present Illness:     48 y.o. f/u CAD and HTN. Circumflex MI [place] August 2013. Had biodegradable stent to OM. Had left arm and jaw pain as anginal equivalent. Had repeat cath 03/2012 with minor non obstructive disease. Wearing CPAP for OSA. Has lost weight with keto diet  Compliant with meds   Wants and [place] [employer] motorcycle Has trip to [place] planned Does construction work so still busy. Thinking of bariatric surgery at [facility] Discussed need for Myovue before any anesthesia or surgery He will call us   No chest pain   The patient does not have symptoms concerning for COVID-19 infection (fever, chills, cough, or new shortness of breath).    Past Medical History:  Diagnosis Date  . Anxiety   . CAD (coronary artery disease)    12/2011- PCI in [place]; s/p nondegradable stent to LAD; nonobstructive residual LAD disease  . Depression   . Gout   . Hyperlipidemia   . Hypertension    Past Surgical History:  Procedure Laterality Date  . NASAL  SEPTUM SURGERY  2006  . TYMPANOSTOMY TUBE PLACEMENT  1976, 1981     Current Meds  Medication Sig  . ALPRAZolam (XANAX) 1 MG tablet TAKE 1 TABLET BY MOUTH EVERY DAY  . aspirin 81 MG chewable tablet Chew 81 mg by mouth daily.  Marland Kitchen. atorvastatin (LIPITOR) 80 MG tablet Take 1 tablet (80 mg total) by mouth daily.  . indomethacin (INDOCIN) 50 MG capsule Take 1 capsule (50 mg total) by mouth 2 (two) times daily as needed. TAKE 1 CAPSULE (50 MG TOTAL) BY MOUTH 2 (TWO) TIMES DAILY WITH A MEAL.  Marland Kitchen. lisinopril (ZESTRIL) 5 MG tablet Take 1 tablet (5 mg total) by mouth daily.  . metoprolol succinate (TOPROL XL) 25 MG 24 hr tablet Take 1 tablet (25 mg total) by mouth daily.  . nitroGLYCERIN (NITROSTAT) 0.4 MG SL tablet Place 1 tablet (0.4 mg total) under the tongue every 5 (five) minutes as needed for chest pain.  Marland Kitchen. sertraline (ZOLOFT) 100 MG tablet TAKE 1.5 TABLETS (150 MG TOTAL) BY MOUTH DAILY.  . [DISCONTINUED] atorvastatin (LIPITOR) 80 MG tablet Take 1 tablet (80 mg total) by mouth daily.  . [DISCONTINUED] lisinopril (PRINIVIL,ZESTRIL) 5 MG tablet Take 1 tablet (5 mg total) by mouth daily.  . [DISCONTINUED] metoprolol succinate (TOPROL XL) 25 MG 24 hr tablet Take 1 tablet (25 mg total) by mouth daily.  . [DISCONTINUED] nitroGLYCERIN (NITROSTAT) 0.4 MG SL tablet Place 1 tablet (0.4 mg total) under the tongue every 5 (five) minutes as  needed for chest pain.     Allergies:   Hctz [hydrochlorothiazide]   Social History   Tobacco Use  . Smoking status: Former Smoker    Last attempt to quit: 03/08/1997    Years since quitting: 21.5  . Smokeless tobacco: Former Neurosurgeon    Quit date: 01/02/2012  . Tobacco comment: quit- 2000  Substance Use Topics  . Alcohol use: No    Comment: not since stent in Aug 2013, occasional 1 every 1-2 months  . Drug use: No     Family Hx: The patient's family history includes Alcohol abuse in his father; Cancer in his paternal aunt and paternal grandmother; Heart attack in his  father and paternal grandfather; Heart disease in his father; Hypertension in his father; Mental illness in his paternal grandmother. There is no history of Stroke.  ROS:   Please see the history of present illness.     All other systems reviewed and are negative.   Prior CV studies:   The following studies were reviewed today:  Cath 2013   Labs/Other Tests and Data Reviewed:    EKG:  SR rate 69 normal 09/11/17  Recent Labs: No results found for requested labs within last 8760 hours.   Recent Lipid Panel Lab Results  Component Value Date/Time   CHOL 206 (H) 06/17/2016 08:21 AM   TRIG 132 06/17/2016 08:21 AM   HDL 46 06/17/2016 08:21 AM   CHOLHDL 4.5 06/17/2016 08:21 AM   CHOLHDL 4 01/24/2015 09:02 AM   LDLCALC 134 (H) 06/17/2016 08:21 AM    Wt Readings from Last 3 Encounters:  09/13/18 (!) 147.4 kg  09/11/17 (!) 154.4 kg  08/07/16 (!) 143.3 kg     Objective:    Vital Signs:  BP 136/82   Pulse 74   Ht 6' (1.829 m)   Wt (!) 147.4 kg   BMI 44.08 kg/m    Well nourished, well developed male in no acute distress. Skin warm and dry No tachypnea No JVP elevation No edema   ASSESSMENT & PLAN:    1. CAD: stent to circumflex 2013 stable no angina continue medical Rx 2. OSA:  Continue CPAP use weight loss is good 3. HLD:  Continue lipitor labs with primary  4. HTN:  Improved with ACE low sodium diet 5. Anxiety/Depression:  Continue zoloft improved f/u primary   COVID-19 Education: The signs and symptoms of COVID-19 were discussed with the patient and how to seek care for testing (follow up with PCP or arrange E-visit).  The importance of social distancing was discussed today.  Time:   Today, I have spent 30 minutes with the patient with telehealth technology discussing the above problems.     Medication Adjustments/Labs and Tests Ordered: Current medicines are reviewed at length with the patient today.  Concerns regarding medicines are outlined above.   Tests  Ordered: No orders of the defined types were placed in this encounter.   Medication Changes: Meds ordered this encounter  Medications  . lisinopril (ZESTRIL) 5 MG tablet    Sig: Take 1 tablet (5 mg total) by mouth daily.    Dispense:  90 tablet    Refill:  3  . metoprolol succinate (TOPROL XL) 25 MG 24 hr tablet    Sig: Take 1 tablet (25 mg total) by mouth daily.    Dispense:  90 tablet    Refill:  3  . nitroGLYCERIN (NITROSTAT) 0.4 MG SL tablet    Sig: Place 1 tablet (0.4 mg  total) under the tongue every 5 (five) minutes as needed for chest pain.    Dispense:  25 tablet    Refill:  3  . atorvastatin (LIPITOR) 80 MG tablet    Sig: Take 1 tablet (80 mg total) by mouth daily.    Dispense:  90 tablet    Refill:  3    Disposition:  Follow up in 6 months   Signed, Charlton Haws, MD  09/13/2018 8:22 AM    [facility]

## 2018-09-13 ENCOUNTER — Other Ambulatory Visit: Payer: Self-pay

## 2018-09-13 ENCOUNTER — Encounter: Payer: Self-pay | Admitting: Cardiovascular Disease

## 2018-09-13 ENCOUNTER — Telehealth (INDEPENDENT_AMBULATORY_CARE_PROVIDER_SITE_OTHER): Payer: BC Managed Care – PPO | Admitting: Cardiovascular Disease

## 2018-09-13 VITALS — BP 136/82 | HR 74 | Ht 72.0 in | Wt 325.0 lb

## 2018-09-13 DIAGNOSIS — E782 Mixed hyperlipidemia: Secondary | ICD-10-CM

## 2018-09-13 DIAGNOSIS — I10 Essential (primary) hypertension: Secondary | ICD-10-CM

## 2018-09-13 DIAGNOSIS — I2510 Atherosclerotic heart disease of native coronary artery without angina pectoris: Secondary | ICD-10-CM

## 2018-09-13 MED ORDER — METOPROLOL SUCCINATE ER 25 MG PO TB24: 25.0000 mg | ORAL_TABLET | Freq: Every day | ORAL | 3 refills | Status: DC

## 2018-09-13 MED ORDER — LISINOPRIL 5 MG PO TABS: 5.0000 mg | ORAL_TABLET | Freq: Every day | ORAL | 3 refills | Status: DC

## 2018-09-13 MED ORDER — NITROGLYCERIN 0.4 MG SL SUBL: 0.4000 mg | SUBLINGUAL_TABLET | SUBLINGUAL | 3 refills | Status: DC | PRN

## 2018-09-13 MED ORDER — ATORVASTATIN CALCIUM 80 MG PO TABS: 80.0000 mg | ORAL_TABLET | Freq: Every day | ORAL | 3 refills | Status: DC

## 2018-09-13 NOTE — Patient Instructions (Signed)

## 2018-10-29 ENCOUNTER — Telehealth: Payer: Self-pay | Admitting: Emergency Medicine

## 2018-10-29 NOTE — Telephone Encounter (Signed)
Left a msg with patient wife. She was informed we received a form and Dr Neva Seat stated he have not seen this patient have not been seen by Dr Neva Seat since 2013 He need to have psychiatry fill out form. Forms will be at front desk for pick up.

## 2019-01-19 ENCOUNTER — Other Ambulatory Visit: Payer: Self-pay | Admitting: Cardiovascular Disease

## 2019-02-25 ENCOUNTER — Telehealth: Payer: Self-pay | Admitting: Cardiovascular Disease

## 2019-02-25 DIAGNOSIS — I2510 Atherosclerotic heart disease of native coronary artery without angina pectoris: Secondary | ICD-10-CM

## 2019-02-25 DIAGNOSIS — Z01818 Encounter for other preprocedural examination: Secondary | ICD-10-CM

## 2019-02-25 NOTE — Telephone Encounter (Signed)
Called patient back about his message. Patient is trying to have bariatric surgery with Dr. Volanda Napoleon with [facility] hopefully between November and the end of the year. Patient wants to know if there is any test he needs to have done to get cleared for surgery and see if it is okay for him to hold his ASA 2 weeks before surgery and 1 week after. Will forward to Dr. Johnsie Cancel for advisement.

## 2019-02-25 NOTE — Telephone Encounter (Signed)
They are picky about patients with history CAD for bariatric surgery order lexiscan myovue to clear

## 2019-02-25 NOTE — Telephone Encounter (Signed)
Patient is planning on having bariatric surgery in the near future. He and Dr. Johnsie Cancel had discussed this in the past, he states Dr. Johnsie Cancel had told him he would want him to come in for a stress test. Patient would like to speak to nurse about this.

## 2019-02-28 ENCOUNTER — Encounter (HOSPITAL_COMMUNITY): Payer: Self-pay | Admitting: Cardiovascular Disease

## 2019-02-28 NOTE — Telephone Encounter (Signed)
Called patient back with Dr. Kyla Balzarine recommendations. Patient will get myoview done to clear him for surgery. Went over instructions. Will send message to scheduling to help get him an appointment. Will send copy of instructions to patient.

## 2019-03-09 ENCOUNTER — Telehealth (HOSPITAL_COMMUNITY): Payer: Self-pay | Admitting: *Deleted

## 2019-03-09 NOTE — Telephone Encounter (Signed)
Patient given detailed instructions per Myocardial Perfusion Study Information Sheet for the test on 03/14/19 at 1:15. Patient notified to arrive 15 minutes early and that it is imperative to arrive on time for appointment to keep from having the test rescheduled.  If you need to cancel or reschedule your appointment, please call the office within 24 hours of your appointment. . Patient verbalized understanding.Jeffery Chapman

## 2019-03-14 ENCOUNTER — Other Ambulatory Visit: Payer: Self-pay

## 2019-03-14 ENCOUNTER — Ambulatory Visit (HOSPITAL_COMMUNITY): Payer: BC Managed Care – PPO | Attending: Cardiovascular Disease

## 2019-03-14 DIAGNOSIS — Z01818 Encounter for other preprocedural examination: Secondary | ICD-10-CM | POA: Diagnosis not present

## 2019-03-14 DIAGNOSIS — I2510 Atherosclerotic heart disease of native coronary artery without angina pectoris: Secondary | ICD-10-CM | POA: Diagnosis present

## 2019-03-14 MED ORDER — REGADENOSON 0.4 MG/5ML IV SOLN
0.4000 mg | Freq: Once | INTRAVENOUS | Status: AC
  Administered 2019-03-14: 0.4 mg via INTRAVENOUS

## 2019-03-14 MED ORDER — TECHNETIUM TC 99M TETROFOSMIN IV KIT
32.5000 | PACK | Freq: Once | INTRAVENOUS | Status: AC | PRN
  Administered 2019-03-14: 32.5 via INTRAVENOUS
  Filled 2019-03-14: qty 33

## 2019-03-15 ENCOUNTER — Ambulatory Visit (HOSPITAL_COMMUNITY): Payer: BC Managed Care – PPO | Attending: Cardiovascular Disease

## 2019-03-15 LAB — MYOCARDIAL PERFUSION IMAGING
LV dias vol: 138 mL (ref 62–150)
LV sys vol: 59 mL
Peak HR: 80 {beats}/min
Rest HR: 55 {beats}/min
SDS: 2
SRS: 2
SSS: 4
TID: 1.03

## 2019-03-15 MED ORDER — TECHNETIUM TC 99M TETROFOSMIN IV KIT
31.4000 | PACK | Freq: Once | INTRAVENOUS | Status: AC | PRN
  Administered 2019-03-15: 31.4 via INTRAVENOUS
  Filled 2019-03-15: qty 32

## 2019-05-25 ENCOUNTER — Other Ambulatory Visit: Payer: Self-pay

## 2019-05-25 ENCOUNTER — Encounter: Payer: Self-pay | Admitting: Adult Health Nurse Practitioner

## 2019-05-25 ENCOUNTER — Telehealth (INDEPENDENT_AMBULATORY_CARE_PROVIDER_SITE_OTHER): Payer: BC Managed Care – PPO | Admitting: Adult Health Nurse Practitioner

## 2019-05-25 DIAGNOSIS — Z20828 Contact with and (suspected) exposure to other viral communicable diseases: Secondary | ICD-10-CM

## 2019-05-25 DIAGNOSIS — Z20822 Contact with and (suspected) exposure to covid-19: Secondary | ICD-10-CM

## 2019-05-25 HISTORY — DX: Contact with and (suspected) exposure to covid-19: Z20.822

## 2019-05-25 MED ORDER — ALBUTEROL SULFATE HFA 108 (90 BASE) MCG/ACT IN AERS: 2.0000 | INHALATION_SPRAY | RESPIRATORY_TRACT | 0 refills | Status: DC | PRN

## 2019-05-25 NOTE — Progress Notes (Signed)
Telemedicine Encounter- SOAP NOTE Established Patient  This telephone encounter was conducted with the patient's (or proxy's) verbal consent via audio telecommunications: yes/no: Yes Patient was instructed to have this encounter in a suitably private space; and to only have persons present to whom they give permission to participate. In addition, patient identity was confirmed by use of name plus two identifiers (DOB and address).  I discussed the limitations, risks, security and privacy concerns of performing an evaluation and management service by telephone and the availability of in person appointments. I also discussed with the patient that there may be a patient responsible charge related to this service. The patient expressed understanding and agreed to proceed.  I spent a total of TIME; 0 MIN TO 60 MIN: 15 minutes talking with the patient or their proxy.  No chief complaint on file.   Subjective   Jeffery Chapman is a 48 y.o. established patient. Telephone visit today for + Covid, Monday, Dec. 28, 2020.  Most of his family has tested positive.  He was starting to have symptoms as was his mother and they   On Saturday, developed a cough which progressed in severity and intensity.  Treated symptomatically initially.  Started having a fever.  Next day, felt like he was hit by a freight train. Very fatigued with chills, fevers, night sweats, and intermittent fevers from Sunday through Monday.     He is isolating in a hotel room currently due to family.  He is concerned he needs something for URI symptoms as he has numerous comorbidities and is high risk.    Cough is intermittent.  He has lost his smell, not taste.   Body aches and fatigue have improved but still present.      Patient Active Problem List   Diagnosis Date Noted  . Encounter by telehealth for suspected COVID-19 05/25/2019  . Sleep apnea 06/14/2016  . Chest pain 01/19/2012  . Hypertension 01/19/2012  . Anxiety  01/19/2012  . Mixed hyperlipidemia 01/14/2012  . Gout attack 01/09/2012  . CAD (coronary artery disease) 01/09/2012  . Localized swelling, mass and lump, neck 01/28/2011    Past Medical History:  Diagnosis Date  . Anxiety   . CAD (coronary artery disease)    12/2011- PCI in [place]; s/p nondegradable stent to LAD; nonobstructive residual LAD disease  . Depression   . Encounter by telehealth for suspected COVID-19 05/25/2019  . Gout   . Hyperlipidemia   . Hypertension     Current Outpatient Medications  Medication Sig Dispense Refill  . ALPRAZolam (XANAX) 1 MG tablet TAKE 1 TABLET BY MOUTH EVERY DAY 10 tablet 0  . aspirin 81 MG chewable tablet Chew 81 mg by mouth daily.    Marland Kitchen atorvastatin (LIPITOR) 80 MG tablet Take 1 tablet (80 mg total) by mouth daily. 90 tablet 3  . indomethacin (INDOCIN) 50 MG capsule Take 1 capsule (50 mg total) by mouth 2 (two) times daily as needed. TAKE 1 CAPSULE (50 MG TOTAL) BY MOUTH 2 (TWO) TIMES DAILY WITH A MEAL. 40 capsule 0  . lisinopril (ZESTRIL) 5 MG tablet Take 1 tablet (5 mg total) by mouth daily. 90 tablet 3  . metoprolol succinate (TOPROL XL) 25 MG 24 hr tablet Take 1 tablet (25 mg total) by mouth daily. 90 tablet 3  . nitroGLYCERIN (NITROSTAT) 0.4 MG SL tablet PLACE 1 TABLET (0.4 MG TOTAL) UNDER THE TONGUE EVERY 5 (FIVE) MINUTES AS NEEDED FOR CHEST PAIN. 75 tablet 1  .  sertraline (ZOLOFT) 100 MG tablet TAKE 1.5 TABLETS (150 MG TOTAL) BY MOUTH DAILY. 45 tablet 0   No current facility-administered medications for this visit.    Allergies  Allergen Reactions  . Hctz [Hydrochlorothiazide]     aggravates gout    Social History   Socioeconomic History  . Marital status: Married    Spouse name: Not on file  . Number of children: Not on file  . Years of education: Not on file  . Highest education level: Not on file  Occupational History  . 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  Tobacco Use  . Smoking status: Former Smoker     Quit date: 03/08/1997    Years since quitting: 22.2  . Smokeless tobacco: Former Systems developer    Quit date: 01/02/2012  . Tobacco comment: quit- 2000  Substance and Sexual Activity  . Alcohol use: No    Comment: not since stent in Aug 2013, occasional 1 every 1-2 months  . Drug use: No  . Sexual activity: Yes  Other Topics Concern  . Not on file  Social History Narrative  . Not on file   Social Determinants of Health   Financial Resource Strain:   . Difficulty of Paying Living Expenses: Not on file  Food Insecurity:   . Worried About Charity fundraiser in the Last Year: Not on file  . Ran Out of Food in the Last Year: Not on file  Transportation Needs:   . Lack of Transportation (Medical): Not on file  . Lack of Transportation (Non-Medical): Not on file  Physical Activity:   . Days of Exercise per Week: Not on file  . Minutes of Exercise per Session: Not on file  Stress:   . Feeling of Stress : Not on file  Social Connections:   . Frequency of Communication with Friends and Family: Not on file  . Frequency of Social Gatherings with Friends and Family: Not on file  . Attends Religious Services: Not on file  . Active Member of Clubs or Organizations: Not on file  . Attends Archivist Meetings: Not on file  . Marital Status: Not on file  Intimate Partner Violence:   . Fear of Current or Ex-Partner: Not on file  . Emotionally Abused: Not on file  . Physically Abused: Not on file  . Sexually Abused: Not on file    Review of Systems  Constitutional: Positive for chills, diaphoresis, fever and malaise/fatigue.  HENT: Positive for nosebleeds. Negative for ear pain and sinus pain.   Eyes: Negative.   Respiratory: Positive for wheezing. Negative for shortness of breath.   Cardiovascular: Negative for chest pain and leg swelling.    Objective    General appearance: oriented to person, place, and time. Mental Status: normal mood, behavior, speech, dress, motor activity,  and thought processes.   Vitals as reported by the patient: There were no vitals filed for this visit.  Diagnoses and all orders for this visit:  Encounter by telehealth for suspected COVID-19     I discussed the assessment and treatment plan with the patient. The patient was provided an opportunity to ask questions and all were answered. The patient agreed with the plan and demonstrated an understanding of the instructions.   The patient was advised to call back or seek an in-person evaluation if the symptoms worsen or if the condition fails to improve as anticipated.  I provided 15 minutes of non-face-to-face time during this encounter.  Glyn Ade, NP  Primary Care at [facility]

## 2019-05-25 NOTE — Patient Instructions (Signed)
Person Under Monitoring Name: Jeffery Chapman  Location: [address] [place] [address]   Infection Prevention Recommendations for Individuals Confirmed to have, or Being Evaluated for, 2019 Novel Coronavirus (COVID-19) Infection Who Receive Care at Home  Individuals who are confirmed to have, or are being evaluated for, COVID-19 should follow the prevention steps below until a healthcare provider or local or state health department says they can return to normal activities.  Stay home except to get medical care You should restrict activities outside your home, except for getting medical care. Do not go to work, school, or public areas, and do not use public transportation or taxis.  Call ahead before visiting your doctor Before your medical appointment, call the healthcare provider and tell them that you have, or are being evaluated for, COVID-19 infection. This will help the healthcare provider's office take steps to keep other people from getting infected. Ask your healthcare provider to call the local or state health department.  Monitor your symptoms Seek prompt medical attention if your illness is worsening (e.g., difficulty breathing). Before going to your medical appointment, call the healthcare provider and tell them that you have, or are being evaluated for, COVID-19 infection. Ask your healthcare provider to call the local or state health department.  Wear a facemask You should wear a facemask that covers your nose and mouth when you are in the same room with other people and when you visit a healthcare provider. People who live with or visit you should also wear a facemask while they are in the same room with you.  Separate yourself from other people in your home As much as possible, you should stay in a different room from other people in your home. Also, you should use a separate bathroom, if available.  Avoid sharing household items You should not share  dishes, drinking glasses, cups, eating utensils, towels, bedding, or other items with other people in your home. After using these items, you should wash them thoroughly with soap and water.  Cover your coughs and sneezes Cover your mouth and nose with a tissue when you cough or sneeze, or you can cough or sneeze into your sleeve. Throw used tissues in a lined trash can, and immediately wash your hands with soap and water for at least 20 seconds or use an alcohol-based hand rub.  Wash your [facility] your hands often and thoroughly with soap and water for at least 20 seconds. You can use an alcohol-based hand sanitizer if soap and water are not available and if your hands are not visibly dirty. Avoid touching your eyes, nose, and mouth with unwashed hands.   Prevention Steps for Caregivers and Household Members of Individuals Confirmed to have, or Being Evaluated for, COVID-19 Infection Being Cared for in the Home  If you live with, or provide care at home for, a person confirmed to have, or being evaluated for, COVID-19 infection please follow these guidelines to prevent infection:  Follow healthcare provider's instructions Make sure that you understand and can help the patient follow any healthcare provider instructions for all care.  Provide for the patient's basic needs You should help the patient with basic needs in the home and provide support for getting groceries, prescriptions, and other personal needs.  Monitor the patient's symptoms If they are getting sicker, call his or her medical provider and tell them that the patient has, or is being evaluated for, COVID-19 infection. This will help the healthcare provider's office  take steps to keep other people from getting infected. Ask the healthcare provider to call the local or state health department.  Limit the number of people who have contact with the patient  If possible, have only one caregiver for the patient.  Other  household members should stay in another home or place of residence. If this is not possible, they should stay  in another room, or be separated from the patient as much as possible. Use a separate bathroom, if available.  Restrict visitors who do not have an essential need to be in the home.  Keep older adults, very young children, and other sick people away from the patient Keep older adults, very young children, and those who have compromised immune systems or chronic health conditions away from the patient. This includes people with chronic heart, lung, or kidney conditions, diabetes, and cancer.  Ensure good ventilation Make sure that shared spaces in the home have good air flow, such as from an air conditioner or an opened window, weather permitting.  Wash your hands often  Wash your hands often and thoroughly with soap and water for at least 20 seconds. You can use an alcohol based hand sanitizer if soap and water are not available and if your hands are not visibly dirty.  Avoid touching your eyes, nose, and mouth with unwashed hands.  Use disposable paper towels to dry your hands. If not available, use dedicated cloth towels and replace them when they become wet.  Wear a facemask and gloves  Wear a disposable facemask at all times in the room and gloves when you touch or have contact with the patient's blood, body fluids, and/or secretions or excretions, such as sweat, saliva, sputum, nasal mucus, vomit, urine, or feces.  Ensure the mask fits over your nose and mouth tightly, and do not touch it during use.  Throw out disposable facemasks and gloves after using them. Do not reuse.  Wash your hands immediately after removing your facemask and gloves.  If your personal clothing becomes contaminated, carefully remove clothing and launder. Wash your hands after handling contaminated clothing.  Place all used disposable facemasks, gloves, and other waste in a lined container before  disposing them with other household waste.  Remove gloves and wash your hands immediately after handling these items.  Do not share dishes, glasses, or other household items with the patient  Avoid sharing household items. You should not share dishes, drinking glasses, cups, eating utensils, towels, bedding, or other items with a patient who is confirmed to have, or being evaluated for, COVID-19 infection.  After the person uses these items, you should wash them thoroughly with soap and water.  Wash laundry thoroughly  Immediately remove and wash clothes or bedding that have blood, body fluids, and/or secretions or excretions, such as sweat, saliva, sputum, nasal mucus, vomit, urine, or feces, on them.  Wear gloves when handling laundry from the patient.  Read and follow directions on labels of laundry or clothing items and detergent. In general, wash and dry with the warmest temperatures recommended on the label.  Clean all areas the individual has used often  Clean all touchable surfaces, such as counters, tabletops, doorknobs, bathroom fixtures, toilets, phones, keyboards, tablets, and bedside tables, every day. Also, clean any surfaces that may have blood, body fluids, and/or secretions or excretions on them.  Wear gloves when cleaning surfaces the patient has come in contact with.  Use a diluted bleach solution (e.g., dilute bleach with 1 part  bleach and 10 parts water) or a household disinfectant with a label that says EPA-registered for coronaviruses. To make a bleach solution at home, add 1 tablespoon of bleach to 1 quart (4 cups) of water. For a larger supply, add  cup of bleach to 1 gallon (16 cups) of water.  Read labels of cleaning products and follow recommendations provided on product labels. Labels contain instructions for safe and effective use of the cleaning product including precautions you should take when applying the product, such as wearing gloves or eye protection  and making sure you have good ventilation during use of the product.  Remove gloves and wash hands immediately after cleaning.  Monitor yourself for signs and symptoms of illness Caregivers and household members are considered close contacts, should monitor their health, and will be asked to limit movement outside of the home to the extent possible. Follow the monitoring steps for close contacts listed on the symptom monitoring form.   ? If you have additional questions, contact your local health department or call the epidemiologist on call at [phone] (available 24/7). ? This guidance is subject to change. For the most up-to-date guidance from [facility], please refer to their website: [email or web address]

## 2019-09-08 NOTE — Progress Notes (Signed)
Evaluation Performed:  Follow-up visit  Date:  09/15/2019   ID:  Jeffery Chapman, DOB [birth year], MRN [id number]  PCP:  Patient, No Pcp Per  Cardiologist:  Johnsie Cancel Electrophysiologist:  None   Chief Complaint:  CAD  History of Present Illness:    49 y.o. f/u CAD and HTN. 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 Had biodegradable stent to OM. Had left arm and jaw pain as anginal equivalent. Had repeat cath 03/2012 with minor non obstructive disease. Wearing CPAP for OSA. Has lost weight with keto diet  Compliant with meds   Wants and [place] motorcycle Has traveled to [place] Does Architect work so still busy. Thinking of bariatric surgery at [facility] with Dr Volanda Napoleon.  Myovue 03/15/19 was normal no ischemia EF 57%  No chest pain   Tested positive for COVID 05/23/19 Did not need to be hospitalized   Metabolic doctor wanted him off beta blocker Lisinopril increased BP high in office  Has two boys playing football One daughter had rough time in [place] and home now Other daughter getting married next year    Past Medical History:  Diagnosis Date  . Anxiety   . CAD (coronary artery disease)    12/2011- PCI in [place]; s/p nondegradable stent to LAD; nonobstructive residual LAD disease  . Depression   . Encounter by telehealth for suspected COVID-19 05/25/2019  . Gout   . Hyperlipidemia   . Hypertension    Past Surgical History:  Procedure Laterality Date  . NASAL SEPTUM SURGERY  2006  . TYMPANOSTOMY TUBE PLACEMENT  1976, 1981     Current Meds  Medication Sig  . albuterol (VENTOLIN HFA) 108 (90 Base) MCG/ACT inhaler Inhale 2 puffs into the lungs every 4 (four) hours as needed for wheezing or shortness of breath.  . ALPRAZolam (XANAX) 1 MG tablet TAKE 1 TABLET BY MOUTH EVERY DAY  . aspirin 81 MG chewable tablet Chew 81 mg by mouth daily.  Marland Kitchen atorvastatin (LIPITOR) 80 MG tablet Take 1 tablet (80 mg total) by mouth daily.  . indomethacin (INDOCIN) 50 MG capsule Take 1  capsule (50 mg total) by mouth 2 (two) times daily as needed. TAKE 1 CAPSULE (50 MG TOTAL) BY MOUTH 2 (TWO) TIMES DAILY WITH A MEAL.  Marland Kitchen lisinopril (ZESTRIL) 5 MG tablet Take 1 tablet (5 mg total) by mouth daily.  . metoprolol succinate (TOPROL XL) 25 MG 24 hr tablet Take 1 tablet (25 mg total) by mouth daily.  . nitroGLYCERIN (NITROSTAT) 0.4 MG SL tablet PLACE 1 TABLET (0.4 MG TOTAL) UNDER THE TONGUE EVERY 5 (FIVE) MINUTES AS NEEDED FOR CHEST PAIN.  Marland Kitchen sertraline (ZOLOFT) 100 MG tablet TAKE 1.5 TABLETS (150 MG TOTAL) BY MOUTH DAILY.     Allergies:   Hctz [hydrochlorothiazide]   Social History   Tobacco Use  . Smoking status: Former Smoker    Quit date: 03/08/1997    Years since quitting: 22.5  . Smokeless tobacco: Former Systems developer    Quit date: 01/02/2012  . Tobacco comment: quit- 2000  Substance Use Topics  . Alcohol use: No    Comment: not since stent in Aug 2013, occasional 1 every 1-2 months  . Drug use: No     Family Hx: The patient's family history includes Alcohol abuse in his father; Cancer in his paternal aunt and paternal grandmother; Heart attack in his father and paternal grandfather; Heart disease in his father; Hypertension in his father; Mental illness in his paternal grandmother. There is no history  of Stroke.  ROS:   Please see the history of present illness.     All other systems reviewed and are negative.   Prior CV studies:   The following studies were reviewed today:  Cath 2013  Myovue 03/15/19   Labs/Other Tests and Data Reviewed:    EKG:  SR rate 69 normal 09/11/17  Recent Labs: No results found for requested labs within last 8760 hours.   Recent Lipid Panel Lab Results  Component Value Date/Time   CHOL 206 (H) 06/17/2016 08:21 AM   TRIG 132 06/17/2016 08:21 AM   HDL 46 06/17/2016 08:21 AM   CHOLHDL 4.5 06/17/2016 08:21 AM   CHOLHDL 4 01/24/2015 09:02 AM   LDLCALC 134 (H) 06/17/2016 08:21 AM    Wt Readings from Last 3 Encounters:  03/14/19 (!)  325 lb (147.4 kg)  09/13/18 (!) 325 lb (147.4 kg)  09/11/17 (!) 340 lb 8 oz (154.4 kg)     Objective:    Vital Signs:  Ht 6' (1.829 m)   BMI 44.08 kg/m    Affect appropriate Obese white male  HEENT: normal Neck supple with no adenopathy JVP normal no bruits no thyromegaly Lungs clear with no wheezing and good diaphragmatic motion Heart:  S1/S2 no murmur, no rub, gallop or click PMI normal Abdomen: benighn, BS positve, no tenderness, no AAA no bruit.  No HSM or HJR Distal pulses intact with no bruits No edema Neuro non-focal Skin warm and dry No muscular weakness   ASSESSMENT & PLAN:    1. CAD: stent to circumflex 2013 stable no angina continue medical Rx Normal myovue October 2020  2. OSA:  Continue CPAP use weight loss is good 3. HLD:  Continue lipitor labs with primary  4. HTN:  Increase lisinipril to 20 mg daily  5. Anxiety/Depression:  Continue zoloft improved f/u primary  6. Obesity:  F/u Dr Dayton Martes ? Bariatric surgery clear to have from cardiology perspective 7. COVID:  Positive December 2020 resolved    Medication Adjustments/Labs and Tests Ordered: Current medicines are reviewed at length with the patient today.  Concerns regarding medicines are outlined above.   Tests Ordered: No orders of the defined types were placed in this encounter.   Medication Changes:  D/C beta blocker per primary  Lisinopril 20 mg daily   Disposition:  Follow up in 6 months   Signed, Charlton Haws, MD  09/15/2019 8:53 AM    [facility]

## 2019-09-15 ENCOUNTER — Other Ambulatory Visit: Payer: Self-pay

## 2019-09-15 ENCOUNTER — Encounter: Payer: Self-pay | Admitting: Cardiovascular Disease

## 2019-09-15 ENCOUNTER — Ambulatory Visit: Payer: BC Managed Care – PPO | Admitting: Cardiovascular Disease

## 2019-09-15 DIAGNOSIS — I10 Essential (primary) hypertension: Secondary | ICD-10-CM

## 2019-09-15 MED ORDER — LISINOPRIL 20 MG PO TABS: 20.0000 mg | ORAL_TABLET | Freq: Every day | ORAL | 3 refills | Status: DC

## 2019-09-15 NOTE — Patient Instructions (Addendum)
Medication Instructions:  Your physician has recommended you make the following change in your medication:  1-STOP Metoprolol  2-Increase Lisinopril 20 mg by mouth daily.  *If you need a refill on your cardiac medications before your next appointment, please call your pharmacy*  Lab Work:  If you have labs (blood work) drawn today and your tests are completely normal, you will receive your results only by: Marland Kitchen MyChart Message (if you have MyChart) OR . A paper copy in the mail If you have any lab test that is abnormal or we need to change your treatment, we will call you to review the results.  Testing/Procedures: None ordered today.  Follow-Up: At [facility], you and your health needs are our priority.  As part of our continuing mission to provide you with exceptional heart care, we have created designated Provider Care Teams.  These Care Teams include your primary Cardiologist (physician) and Advanced Practice Providers (APPs -  Physician Assistants and Nurse Practitioners) who all work together to provide you with the care you need, when you need it.  We recommend signing up for the patient portal called "MyChart".  Sign up information is provided on this After Visit Summary.  MyChart is used to connect with patients for Virtual Visits (Telemedicine).  Patients are able to view lab/test results, encounter notes, upcoming appointments, etc.  Non-urgent messages can be sent to your provider as well.   To learn more about what you can do with MyChart, go to [email or web address].    Your next appointment:   6 months  The format for your next appointment:   In Person  Provider:   You may see Dr. Eden Emms or one of the following Advanced Practice Providers on your designated Care Team:    Norma Fredrickson, NP  Nada Boozer, NP  Georgie Chard, NP

## 2019-10-11 ENCOUNTER — Other Ambulatory Visit: Payer: Self-pay | Admitting: Cardiovascular Disease

## 2019-10-11 DIAGNOSIS — E782 Mixed hyperlipidemia: Secondary | ICD-10-CM

## 2020-02-24 ENCOUNTER — Encounter: Payer: Self-pay | Admitting: Family Medicine

## 2020-02-24 ENCOUNTER — Ambulatory Visit (INDEPENDENT_AMBULATORY_CARE_PROVIDER_SITE_OTHER): Payer: BC Managed Care – PPO

## 2020-02-24 ENCOUNTER — Ambulatory Visit: Payer: BC Managed Care – PPO | Admitting: Family Medicine

## 2020-02-24 ENCOUNTER — Other Ambulatory Visit: Payer: Self-pay

## 2020-02-24 VITALS — BP 128/75 | HR 69 | Temp 97.7°F | Ht 71.0 in | Wt 300.2 lb

## 2020-02-24 DIAGNOSIS — R1031 Right lower quadrant pain: Secondary | ICD-10-CM | POA: Diagnosis not present

## 2020-02-24 DIAGNOSIS — G8929 Other chronic pain: Secondary | ICD-10-CM

## 2020-02-24 DIAGNOSIS — R109 Unspecified abdominal pain: Secondary | ICD-10-CM | POA: Diagnosis not present

## 2020-02-24 DIAGNOSIS — Z23 Encounter for immunization: Secondary | ICD-10-CM

## 2020-02-24 LAB — POCT URINALYSIS DIP (MANUAL ENTRY)
Bilirubin, UA: NEGATIVE
Blood, UA: NEGATIVE
Glucose, UA: NEGATIVE mg/dL
Ketones, POC UA: NEGATIVE mg/dL
Leukocytes, UA: NEGATIVE
Nitrite, UA: NEGATIVE
Protein Ur, POC: NEGATIVE mg/dL
Spec Grav, UA: >=1.03 — AB (ref 1.010–1.025)
Urobilinogen, UA: 0.2 E.U./dL
pH, UA: 6 (ref 5.0–8.0)

## 2020-02-24 NOTE — Patient Instructions (Addendum)
Ultrasound of abdomen has been scheduled.  I have asked them to call me the report, but I will be going out of the country after next Thursday and they are to just contact the office if needed.  Return at anytime if acutely worse.

## 2020-02-24 NOTE — Progress Notes (Signed)
Patient ID: Jeffery Chapman, male    DOB: Oct 19, 1970  Age: 49 y.o. MRN: [id number]  Chief Complaint  Patient presents with  . Back Pain    Pt has been experiencing some low back pain for th past 5 months. He also said that he has been Dx with a herninated disc    Subjective:  49 year old male who is here with a over 38-month history of right flank pain.  It comes intermittently.  When he lays on that side it hurts.  It sometimes nonstop and feels like he is laying on something in that side.  He hurts around into the right lower quadrant.  No nausea or vomiting or other GI complaints.  No urinary complaints.  His urine is discolored from some of the medications he takes from his aesthetics doctor.  He has been working on weight loss.  He was told he needed to get a gastric bypass done but with this program he is on his lost over 60 pounds.  He does have a history of gout but does not think that he would have a uric acid stone at this time.  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  A lot of the time is behind the desk.  He does not get a lot of regular exercise.  Knows of no specific injury.  He has a remote history of a bulging disc that was treated with an injection but he has not been having ongoing problems with that he knows what that pain feels like.  Current allergies, medications, problem list, past/family and social histories reviewed.  Objective:  BP 128/75 (BP Location: Right Arm, Patient Position: Sitting, Cuff Size: Large)   Pulse 69   Temp 97.7 F (36.5 C) (Temporal)   Ht 5\' 11"  (1.803 m)   Wt (!) 300 lb 3.2 oz (136.2 kg)   SpO2 96%   BMI 41.87 kg/m   Morbidly obese male, alert and oriented.  No CVA tenderness.  No spine tenderness.  No range of motion tenderness.  Abdomen was soft without hepatomegaly or masses.  He is tender in the right lower quadrant just above McBurney's point.  It is also probably just a  little lateral to that.  No other findings.  Straight leg test was negative  Assessment & Plan:   Assessment: 1. Right flank pain, chronic   2. Abdominal pain, chronic, right lower quadrant   3. Need for prophylactic vaccination with combined diphtheria-tetanus-pertussis (DTP) vaccine   4. Need for prophylactic vaccination and inoculation against influenza       Plan: Abdominal x-ray was normal.  Decided to get a ultrasound of his abdomen and check some labs and go from there.  He is most concerned about whether he has developed uric acid stones with having had the hydrochlorothiazide added to his medicines this year and he has history of gout.  Orders Placed This Encounter  Procedures  . DG Abd 2 Views    Standing Status:   Future    Number of Occurrences:   1    Standing Expiration Date:   02/23/2021    Order Specific Question:   Reason for Exam (SYMPTOM  OR DIAGNOSIS REQUIRED)    Answer:   right flank pain; r/o kidney stone    Order Specific Question:   Preferred imaging location?    Answer:   [facility]  . 04/25/2021 Abdomen Complete  Dr. Alwyn Ren out of country after 10/7 so call [facility] if needed.    Standing Status:   Future    Standing Expiration Date:   02/23/2021    Order Specific Question:   Reason for Exam (SYMPTOM  OR DIAGNOSIS REQUIRED)    Answer:   right flank and rlq pain for 6 months.  looking for stone or other soft tissue lesion causing pain    Order Specific Question:   Preferred imaging location?    Answer:   [id number] [address]    Order Specific Question:   Call Results- Best Contact Number?    Answer:   Dr. Alwyn Ren [phone]  . Flu Vaccine QUAD 36+ mos IM  . Tdap vaccine greater than or equal to 7yo IM  . CBC  . Comprehensive metabolic panel  . Lipase  . POCT urinalysis dipstick    No orders of the defined types were placed in this encounter.        Patient Instructions  Ultrasound of abdomen has been scheduled.  I have asked them to call me  the report, but I will be going out of the country after next Thursday and they are to just contact the office if needed.  Return at anytime if acutely worse.    Return if symptoms worsen or fail to improve.   Janace Hoard, MD 02/24/2020

## 2020-02-25 LAB — COMPREHENSIVE METABOLIC PANEL
ALT: 36 IU/L (ref 0–44)
AST: 25 IU/L (ref 0–40)
Albumin/Globulin Ratio: 1.7 (ref 1.2–2.2)
Albumin: 4.7 g/dL (ref 4.0–5.0)
Alkaline Phosphatase: 80 IU/L (ref 44–121)
BUN/Creatinine Ratio: 10 (ref 9–20)
BUN: 11 mg/dL (ref 6–24)
Bilirubin Total: 0.5 mg/dL (ref 0.0–1.2)
CO2: 27 mmol/L (ref 20–29)
Calcium: 10.7 mg/dL — ABNORMAL HIGH (ref 8.7–10.2)
Chloride: 101 mmol/L (ref 96–106)
Creatinine, Ser: 1.06 mg/dL (ref 0.76–1.27)
GFR calc Af Amer: 94 mL/min/{1.73_m2} (ref >59)
GFR calc non Af Amer: 81 mL/min/{1.73_m2} (ref >59)
Globulin, Total: 2.8 g/dL (ref 1.5–4.5)
Glucose: 85 mg/dL (ref 65–99)
Potassium: 3.9 mmol/L (ref 3.5–5.2)
Sodium: 141 mmol/L (ref 134–144)
Total Protein: 7.5 g/dL (ref 6.0–8.5)

## 2020-02-25 LAB — CBC
Hematocrit: 44.7 % (ref 37.5–51.0)
Hemoglobin: 15.2 g/dL (ref 13.0–17.7)
MCH: 30.6 pg (ref 26.6–33.0)
MCHC: 34 g/dL (ref 31.5–35.7)
MCV: 90 fL (ref 79–97)
Platelets: 212 10*3/uL (ref 150–450)
RBC: 4.96 x10E6/uL (ref 4.14–5.80)
RDW: 14.2 % (ref 11.6–15.4)
WBC: 6.3 10*3/uL (ref 3.4–10.8)

## 2020-02-25 LAB — LIPASE: Lipase: 43 U/L (ref 13–78)

## 2020-02-29 NOTE — Progress Notes (Signed)
Evaluation Performed:  Follow-up visit  Date:  03/13/2020   ID:  Jeffery Chapman, DOB 29-May-1970, MRN [id number]  PCP:  Patient, No Pcp Per  Cardiologist:  Eden Emms Electrophysiologist:  None   Chief Complaint:  CAD  History of Present Illness:    49 y.o. f/u CAD and HTN. Circumflex MI [place] August 2013. Had biodegradable stent to OM. Had left arm and jaw pain as anginal equivalent. Had repeat cath 03/2012 with minor non obstructive disease. Wearing CPAP for OSA. Has lost weight with keto diet  Compliant with meds   Wants and [place] [employer] motorcycle Has traveled to [place] Does Holiday representative work so still busy. Thinking of bariatric surgery at [facility] with Dr Clent Ridges.  Myovue 03/15/19 was normal no ischemia EF 57%  No chest pain   Tested positive for COVID 05/23/19 Did not need to be hospitalized   Metabolic doctor wanted him off beta blocker Lisinopril increased BP high in office 02/24/20 saw Dr Alwyn Ren with right flank pain concern uric acid stone on diuretic  Plain film abdomen negative to have [place] 03/05/20    Has two boys playing football One daughter had rough time in [place] and home now taking courses at [employer] Other daughter got married at [employer] in August   He has lost 70 lbs with diet and glucophage/rybelsus which are prescribed for weight loss    Past Medical History:  Diagnosis Date  . Anxiety   . CAD (coronary artery disease)    12/2011- PCI in [place]; s/p nondegradable stent to LAD; nonobstructive residual LAD disease  . Depression   . Encounter by telehealth for suspected COVID-19 05/25/2019  . Gout   . Hyperlipidemia   . Hypertension    Past Surgical History:  Procedure Laterality Date  . NASAL SEPTUM SURGERY  2006  . TYMPANOSTOMY TUBE PLACEMENT  1976, 1981     Current Meds  Medication Sig  . ALPRAZolam (XANAX) 1 MG tablet TAKE 1 TABLET BY MOUTH EVERY DAY  . anastrozole (ARIMIDEX) 1 MG tablet Take 1 mg by mouth daily.  Marland Kitchen aspirin 81 MG  chewable tablet Chew 81 mg by mouth daily.  Marland Kitchen atorvastatin (LIPITOR) 80 MG tablet Take 1 tablet (80 mg total) by mouth daily.  Claris Gower Grape-Goldenseal (BERBERINE COMPLEX PO) Take 1 tablet by mouth in the morning and at bedtime.  Marland Kitchen BORON PO Take 1 tablet by mouth in the morning and at bedtime.  . Cinnamon 500 MG TABS Take 1 tablet by mouth in the morning and at bedtime.  . Cyanocobalamin (VITAMIN B-12 IJ) Inject 1 mL as directed every 7 (seven) days.  . indomethacin (INDOCIN) 50 MG capsule Take 1 capsule (50 mg total) by mouth 2 (two) times daily as needed. TAKE 1 CAPSULE (50 MG TOTAL) BY MOUTH 2 (TWO) TIMES DAILY WITH A MEAL.  Marland Kitchen lisinopril-hydrochlorothiazide (ZESTORETIC) 20-25 MG tablet Take 1 tablet by mouth daily.  . metFORMIN (GLUCOPHAGE) 500 MG tablet Take 500 mg by mouth 2 (two) times daily with a meal.  . nitroGLYCERIN (NITROSTAT) 0.4 MG SL tablet PLACE 1 TABLET (0.4 MG TOTAL) UNDER THE TONGUE EVERY 5 (FIVE) MINUTES AS NEEDED FOR CHEST PAIN.  Marland Kitchen OVER THE COUNTER MEDICATION Place 1 tablet under the tongue every 7 (seven) days. Kisspeptin  . RYBELSUS 14 MG TABS Take 1 tablet by mouth daily.  . sertraline (ZOLOFT) 100 MG tablet TAKE 1.5 TABLETS (150 MG TOTAL) BY MOUTH DAILY.  Marland Kitchen testosterone cypionate (DEPOTESTOSTERONE CYPIONATE) 200 MG/ML injection Inject  100 mg into the muscle every 7 (seven) days.   . [DISCONTINUED] atorvastatin (LIPITOR) 80 MG tablet TAKE 1 TABLET BY MOUTH EVERY DAY     Allergies:   Hctz [hydrochlorothiazide]   Social History   Tobacco Use  . Smoking status: Former Smoker    Quit date: 03/08/1997    Years since quitting: 23.0  . Smokeless tobacco: Former Neurosurgeon    Quit date: 01/02/2012  . Tobacco comment: quit- 2000  Substance Use Topics  . Alcohol use: No    Comment: not since stent in Aug 2013, occasional 1 every 1-2 months  . Drug use: No     Family Hx: The patient's family history includes Alcohol abuse in his father; Cancer in his paternal aunt and  paternal grandmother; Heart attack in his father and paternal grandfather; Heart disease in his father; Hypertension in his father; Mental illness in his paternal grandmother. There is no history of Stroke.  ROS:   Please see the history of present illness.     All other systems reviewed and are negative.   Prior CV studies:   The following studies were reviewed today:  Cath 2013  Myovue 03/15/19   Labs/Other Tests and Data Reviewed:    EKG:  SR rate 69 normal 09/11/17  Recent Labs: 02/24/2020: ALT 36; BUN 11; Creatinine, Ser 1.06; Hemoglobin 15.2; Platelets 212; Potassium 3.9; Sodium 141   Recent Lipid Panel Lab Results  Component Value Date/Time   CHOL 206 (H) 06/17/2016 08:21 AM   TRIG 132 06/17/2016 08:21 AM   HDL 46 06/17/2016 08:21 AM   CHOLHDL 4.5 06/17/2016 08:21 AM   CHOLHDL 4 01/24/2015 09:02 AM   LDLCALC 134 (H) 06/17/2016 08:21 AM    Wt Readings from Last 3 Encounters:  03/13/20 294 lb 3.2 oz (133.4 kg)  02/24/20 (!) 300 lb 3.2 oz (136.2 kg)  09/15/19 (!) 347 lb (157.4 kg)     Objective:    Vital Signs:  BP 126/84   Pulse 80   Ht 5\' 11"  (1.803 m)   Wt 294 lb 3.2 oz (133.4 kg)   SpO2 96%   BMI 41.03 kg/m    Affect appropriate Obese white male  HEENT: normal Neck supple with no adenopathy JVP normal no bruits no thyromegaly Lungs clear with no wheezing and good diaphragmatic motion Heart:  S1/S2 no murmur, no rub, gallop or click PMI normal Abdomen: benighn, BS positve, no tenderness, no AAA no bruit.  No HSM or HJR Distal pulses intact with no bruits No edema Neuro non-focal Skin warm and dry No muscular weakness   ASSESSMENT & PLAN:    1. CAD: stent to circumflex 2013 stable no angina continue medical Rx Normal myovue October 2020  2. OSA:  Continue CPAP use weight loss is good 3. HLD:  Continue lipitor labs with primary  4. HTN:  Off beta blocker on lisinopril 20 mg daily now with diuretic  5. Anxiety/Depression:  Continue zoloft  improved f/u primary  6. Obesity:  F/u Dr November 2020 no need for bariatric surgery with current weight loss efforts  7. COVID:  Positive December 2020 resolved    Medication Adjustments/Labs and Tests Ordered: Current medicines are reviewed at length with the patient today.  Concerns regarding medicines are outlined above.   Tests Ordered: No orders of the defined types were placed in this encounter.   Medication Changes:  None  Disposition:  Follow up in 6 months   Signed, January 2021, MD  03/13/2020  8:59 AM    [facility]

## 2020-03-05 ENCOUNTER — Ambulatory Visit
Admission: RE | Admit: 2020-03-05 | Discharge: 2020-03-05 | Disposition: A | Discharge status: Home / Self Care | Payer: BC Managed Care – PPO | Source: Ambulatory Visit | Attending: Family Medicine | Admitting: Family Medicine

## 2020-03-05 DIAGNOSIS — R1031 Right lower quadrant pain: Secondary | ICD-10-CM

## 2020-03-05 DIAGNOSIS — G8929 Other chronic pain: Secondary | ICD-10-CM

## 2020-03-13 ENCOUNTER — Ambulatory Visit: Payer: BC Managed Care – PPO | Admitting: Cardiovascular Disease

## 2020-03-13 ENCOUNTER — Other Ambulatory Visit: Payer: Self-pay

## 2020-03-13 ENCOUNTER — Encounter: Payer: Self-pay | Admitting: Cardiovascular Disease

## 2020-03-13 DIAGNOSIS — E782 Mixed hyperlipidemia: Secondary | ICD-10-CM | POA: Diagnosis not present

## 2020-03-13 MED ORDER — ATORVASTATIN CALCIUM 80 MG PO TABS: 80.0000 mg | ORAL_TABLET | Freq: Every day | ORAL | 3 refills | Status: DC

## 2020-03-13 NOTE — Patient Instructions (Addendum)

## 2020-04-18 ENCOUNTER — Encounter: Payer: Self-pay | Admitting: Registered Nurse

## 2020-04-18 ENCOUNTER — Ambulatory Visit: Payer: BC Managed Care – PPO | Admitting: Registered Nurse

## 2020-04-18 ENCOUNTER — Other Ambulatory Visit: Payer: Self-pay

## 2020-04-18 VITALS — BP 130/84 | HR 77 | Temp 98.0°F | Resp 18 | Ht 71.0 in | Wt 291.8 lb

## 2020-04-18 DIAGNOSIS — Z7689 Persons encountering health services in other specified circumstances: Secondary | ICD-10-CM

## 2020-04-18 DIAGNOSIS — I25119 Atherosclerotic heart disease of native coronary artery with unspecified angina pectoris: Secondary | ICD-10-CM

## 2020-04-18 DIAGNOSIS — F419 Anxiety disorder, unspecified: Secondary | ICD-10-CM | POA: Diagnosis not present

## 2020-04-18 DIAGNOSIS — Z1211 Encounter for screening for malignant neoplasm of colon: Secondary | ICD-10-CM

## 2020-04-18 NOTE — Patient Instructions (Signed)
° ° ° °  If you have lab work done today you will be contacted with your lab results within the next 2 weeks.  If you have not heard from us then please contact us. The fastest way to get your results is to register for My Chart. ° ° °IF you received an x-ray today, you will receive an invoice from [facility]. Please contact [facility] at [phone] with questions or concerns regarding your invoice.  ° °IF you received labwork today, you will receive an invoice from [facility]. Please contact [facility] at [phone] with questions or concerns regarding your invoice.  ° °Our billing staff will not be able to assist you with questions regarding bills from these companies. ° °You will be contacted with the lab results as soon as they are available. The fastest way to get your results is to activate your My Chart account. Instructions are located on the last page of this paperwork. If you have not heard from us regarding the results in 2 weeks, please contact this office. °  ° ° ° °

## 2020-06-25 ENCOUNTER — Encounter: Payer: Self-pay | Admitting: Registered Nurse

## 2020-06-25 NOTE — Progress Notes (Signed)
Established Patient Office Visit  Subjective:  Patient ID: Jeffery Chapman, male    DOB: 1970-10-12  Age: 50 y.o. MRN: [id number]  CC:  Chief Complaint  Patient presents with  . Transitions Of Care    patient is here for TOC. Patient has no questions or concerns.    HPI Jeffery Chapman presents for visit to est care  Histories reviewed, updated as warranted  Feels his chronic conditions are well controlled at this time  Due for colon cancer screening, will place order today for cologuard.  Recent labs reviewed with patient.  Past Medical History:  Diagnosis Date  . Anxiety   . CAD (coronary artery disease)    12/2011- PCI in [place]; s/p nondegradable stent to LAD; nonobstructive residual LAD disease  . Depression   . Encounter by telehealth for suspected COVID-19 05/25/2019  . Gout   . Hyperlipidemia   . Hypertension     Past Surgical History:  Procedure Laterality Date  . NASAL SEPTUM SURGERY  2006  . TYMPANOSTOMY TUBE PLACEMENT  1976, 1981    Family History  Problem Relation Age of Onset  . Heart disease Father   . Heart attack Father   . Hypertension Father   . Cancer Paternal Aunt        breast  . Cancer Paternal Grandmother        breast  . Mental illness Paternal Grandmother 104       suspected dementia  . Heart attack Paternal Grandfather   . Stroke Neg Hx     Social History   Socioeconomic History  . Marital status: Married    Spouse name: Not on file  . Number of children: Not on file  . Years of education: Not on file  . Highest education level: Not on file  Occupational History  . 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  Tobacco Use  . Smoking status: Former Smoker    Quit date: 03/08/1997    Years since quitting: 23.3  . Smokeless tobacco: Former Neurosurgeon    Quit date: 01/02/2012  . Tobacco comment: quit- 2000  Vaping Use  . Vaping Use: Never used  Substance and Sexual Activity  . Alcohol use: No    Comment: not since stent  in Aug 2013, occasional 1 every 1-2 months  . Drug use: No  . Sexual activity: Yes  Other Topics Concern  . Not on file  Social History Narrative  . Not on file   Social Determinants of Health   Financial Resource Strain: Not on file  Food Insecurity: Not on file  Transportation Needs: Not on file  Physical Activity: Not on file  Stress: Not on file  Social Connections: Not on file  Intimate Partner Violence: Not on file    Outpatient Medications Prior to Visit  Medication Sig Dispense Refill  . ALPRAZolam (XANAX) 1 MG tablet TAKE 1 TABLET BY MOUTH EVERY DAY 10 tablet 0  . anastrozole (ARIMIDEX) 1 MG tablet Take 1 mg by mouth daily.    Marland Kitchen aspirin 81 MG chewable tablet Chew 81 mg by mouth daily.    Marland Kitchen atorvastatin (LIPITOR) 80 MG tablet Take 1 tablet (80 mg total) by mouth daily. 90 tablet 3  . BORON PO Take 1 tablet by mouth in the morning and at bedtime.    . Cinnamon 500 MG TABS Take 1 tablet by mouth in the morning and at bedtime.    . Cyanocobalamin (VITAMIN B-12 IJ)  Inject 1 mL as directed every 7 (seven) days.    . indomethacin (INDOCIN) 50 MG capsule Take 1 capsule (50 mg total) by mouth 2 (two) times daily as needed. TAKE 1 CAPSULE (50 MG TOTAL) BY MOUTH 2 (TWO) TIMES DAILY WITH A MEAL. 40 capsule 0  . lisinopril-hydrochlorothiazide (ZESTORETIC) 20-25 MG tablet Take 1 tablet by mouth daily.    . metFORMIN (GLUCOPHAGE) 500 MG tablet Take 500 mg by mouth 2 (two) times daily with a meal.    . nitroGLYCERIN (NITROSTAT) 0.4 MG SL tablet PLACE 1 TABLET (0.4 MG TOTAL) UNDER THE TONGUE EVERY 5 (FIVE) MINUTES AS NEEDED FOR CHEST PAIN. 75 tablet 1  . OVER THE COUNTER MEDICATION Place 1 tablet under the tongue every 7 (seven) days. Kisspeptin    . RYBELSUS 14 MG TABS Take 1 tablet by mouth daily.    . sertraline (ZOLOFT) 100 MG tablet TAKE 1.5 TABLETS (150 MG TOTAL) BY MOUTH DAILY. 45 tablet 0  . testosterone cypionate (DEPOTESTOSTERONE CYPIONATE) 200 MG/ML injection Inject 100 mg into  the muscle every 7 (seven) days.     Claris Gower Grape-Goldenseal (BERBERINE COMPLEX PO) Take 1 tablet by mouth in the morning and at bedtime. (Patient not taking: Reported on 04/18/2020)     No facility-administered medications prior to visit.    Allergies  Allergen Reactions  . Hctz [Hydrochlorothiazide]     aggravates gout    ROS Review of Systems  Constitutional: Negative.   HENT: Negative.   Eyes: Negative.   Respiratory: Negative.   Cardiovascular: Negative.   Gastrointestinal: Negative.   Genitourinary: Negative.   Musculoskeletal: Negative.   Skin: Negative.   Neurological: Negative.   Psychiatric/Behavioral: Negative.   All other systems reviewed and are negative.     Objective:    Physical Exam Constitutional:      General: He is not in acute distress.    Appearance: Normal appearance. He is normal weight. He is not ill-appearing, toxic-appearing or diaphoretic.  Cardiovascular:     Rate and Rhythm: Normal rate and regular rhythm.     Heart sounds: Normal heart sounds. No murmur heard. No friction rub. No gallop.   Pulmonary:     Effort: Pulmonary effort is normal. No respiratory distress.     Breath sounds: Normal breath sounds. No stridor. No wheezing, rhonchi or rales.  Chest:     Chest wall: No tenderness.  Neurological:     General: No focal deficit present.     Mental Status: He is alert and oriented to person, place, and time. Mental status is at baseline.  Psychiatric:        Mood and Affect: Mood normal.        Behavior: Behavior normal.        Thought Content: Thought content normal.        Judgment: Judgment normal.     BP 130/84   Pulse 77   Temp 98 F (36.7 C) (Temporal)   Resp 18   Ht 5\' 11"  (1.803 m)   Wt 291 lb 12.8 oz (132.4 kg)   SpO2 97%   BMI 40.70 kg/m  Wt Readings from Last 3 Encounters:  04/18/20 291 lb 12.8 oz (132.4 kg)  03/13/20 294 lb 3.2 oz (133.4 kg)  02/24/20 (!) 300 lb 3.2 oz (136.2 kg)     Health  Maintenance Due  Topic Date Due  . COVID-19 Vaccine (2 - Booster for Janssen series) 11/23/2019    There are no preventive care  reminders to display for this patient.  Lab Results  Component Value Date   TSH 2.387 12/31/2014   Lab Results  Component Value Date   WBC 6.3 02/24/2020   HGB 15.2 02/24/2020   HCT 44.7 02/24/2020   MCV 90 02/24/2020   PLT 212 02/24/2020   Lab Results  Component Value Date   NA 141 02/24/2020   K 3.9 02/24/2020   CO2 27 02/24/2020   GLUCOSE 85 02/24/2020   BUN 11 02/24/2020   CREATININE 1.06 02/24/2020   BILITOT 0.5 02/24/2020   ALKPHOS 80 02/24/2020   AST 25 02/24/2020   ALT 36 02/24/2020   PROT 7.5 02/24/2020   ALBUMIN 4.7 02/24/2020   CALCIUM 10.7 (H) 02/24/2020   GFR 98.14 04/13/2012   Lab Results  Component Value Date   CHOL 206 (H) 06/17/2016   Lab Results  Component Value Date   HDL 46 06/17/2016   Lab Results  Component Value Date   LDLCALC 134 (H) 06/17/2016   Lab Results  Component Value Date   TRIG 132 06/17/2016   Lab Results  Component Value Date   CHOLHDL 4.5 06/17/2016   Lab Results  Component Value Date   HGBA1C 5.5 06/17/2016      Assessment & Plan:   Problem List Items Addressed This Visit      Cardiovascular and Mediastinum   CAD (coronary artery disease)     Other   Anxiety    Other Visit Diagnoses    Colon cancer screening    -  Primary   Relevant Orders   Cologuard   Encounter to establish care          No orders of the defined types were placed in this encounter.   Follow-up: No follow-ups on file.   PLAN  Follow up when due for refills on medications  Submit cologuard within 3 weeks if possible  Return for CPE and labs in 2022  Patient encouraged to call clinic with any questions, comments, or concerns.  Janeece Agee, NP

## 2021-03-25 ENCOUNTER — Other Ambulatory Visit: Payer: Self-pay | Admitting: Orthopedic Surgery

## 2021-03-25 DIAGNOSIS — M25561 Pain in right knee: Secondary | ICD-10-CM

## 2021-04-05 ENCOUNTER — Other Ambulatory Visit: Payer: BC Managed Care – PPO

## 2021-08-27 IMAGING — DX DG ABDOMEN 2V
3 series · 3 of 3 positions shown · non-contrast
Comparison: None.

CLINICAL DATA: Right flank pain.

EXAM:
X-RAY ABDOMEN 3 VIEWS

[abdomen erect]
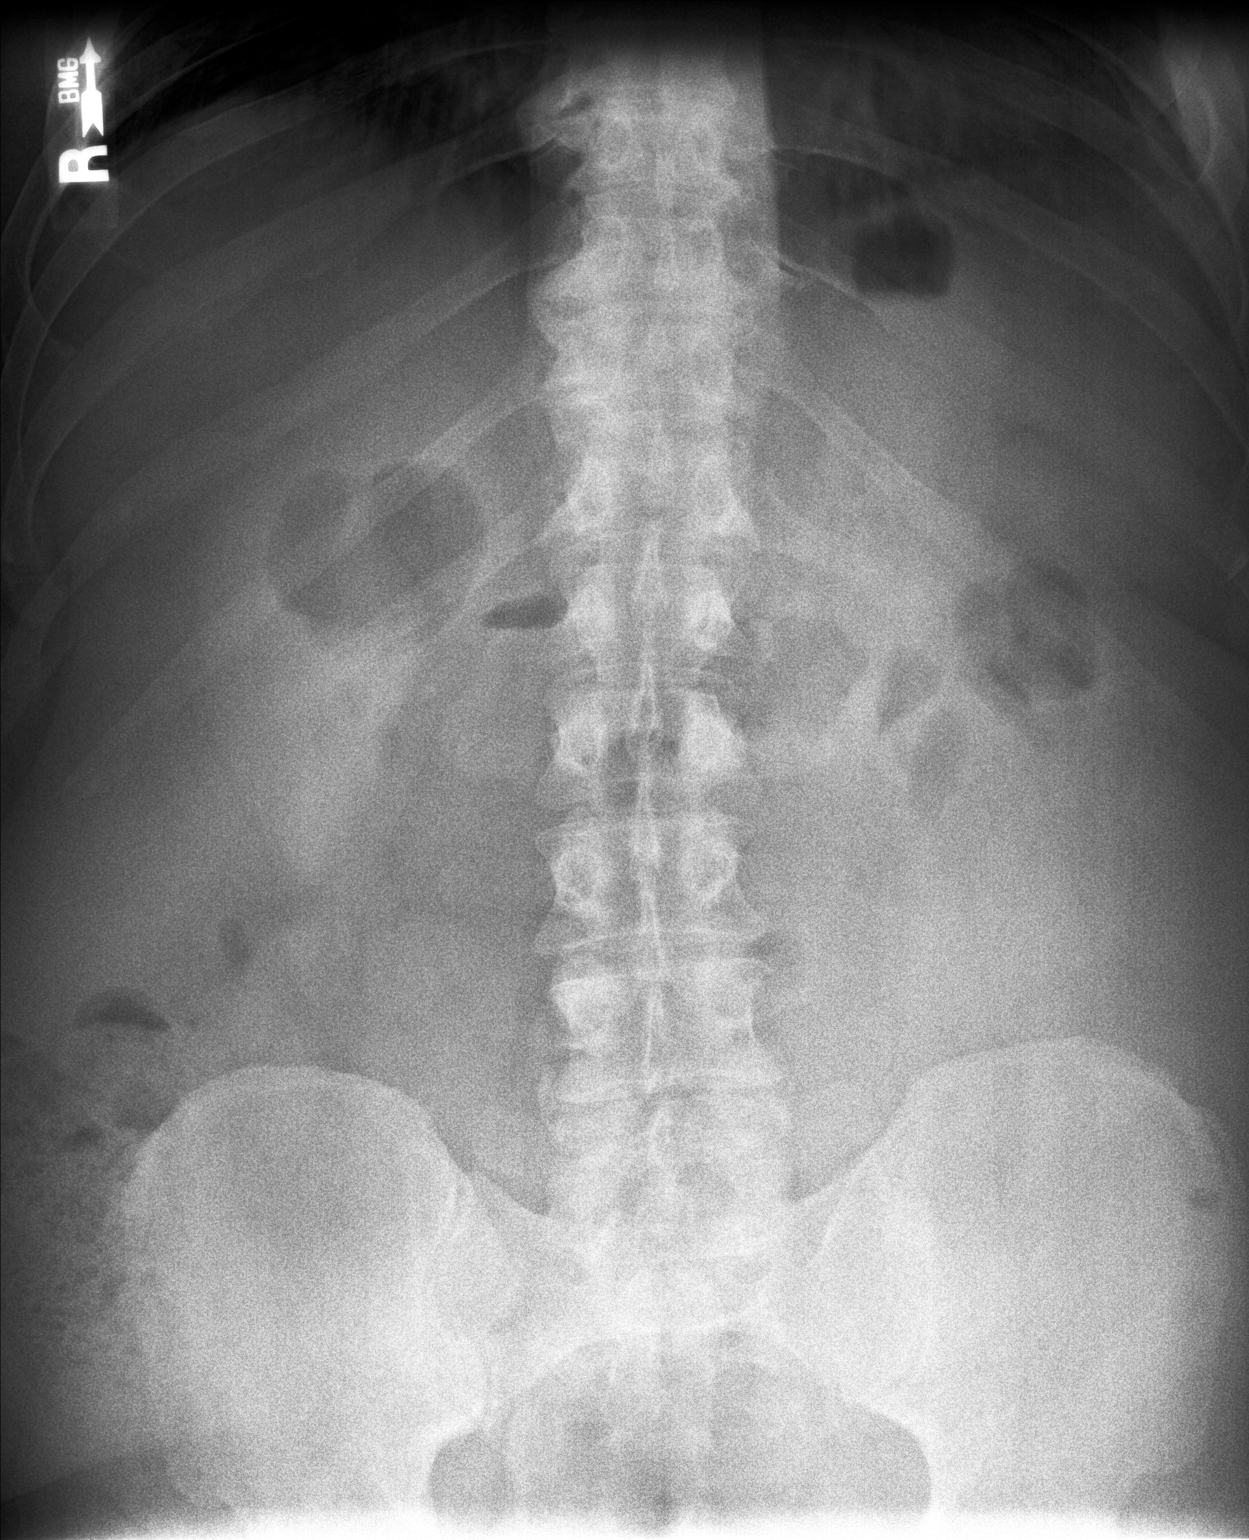

[abdomen supine (1 of 2)]
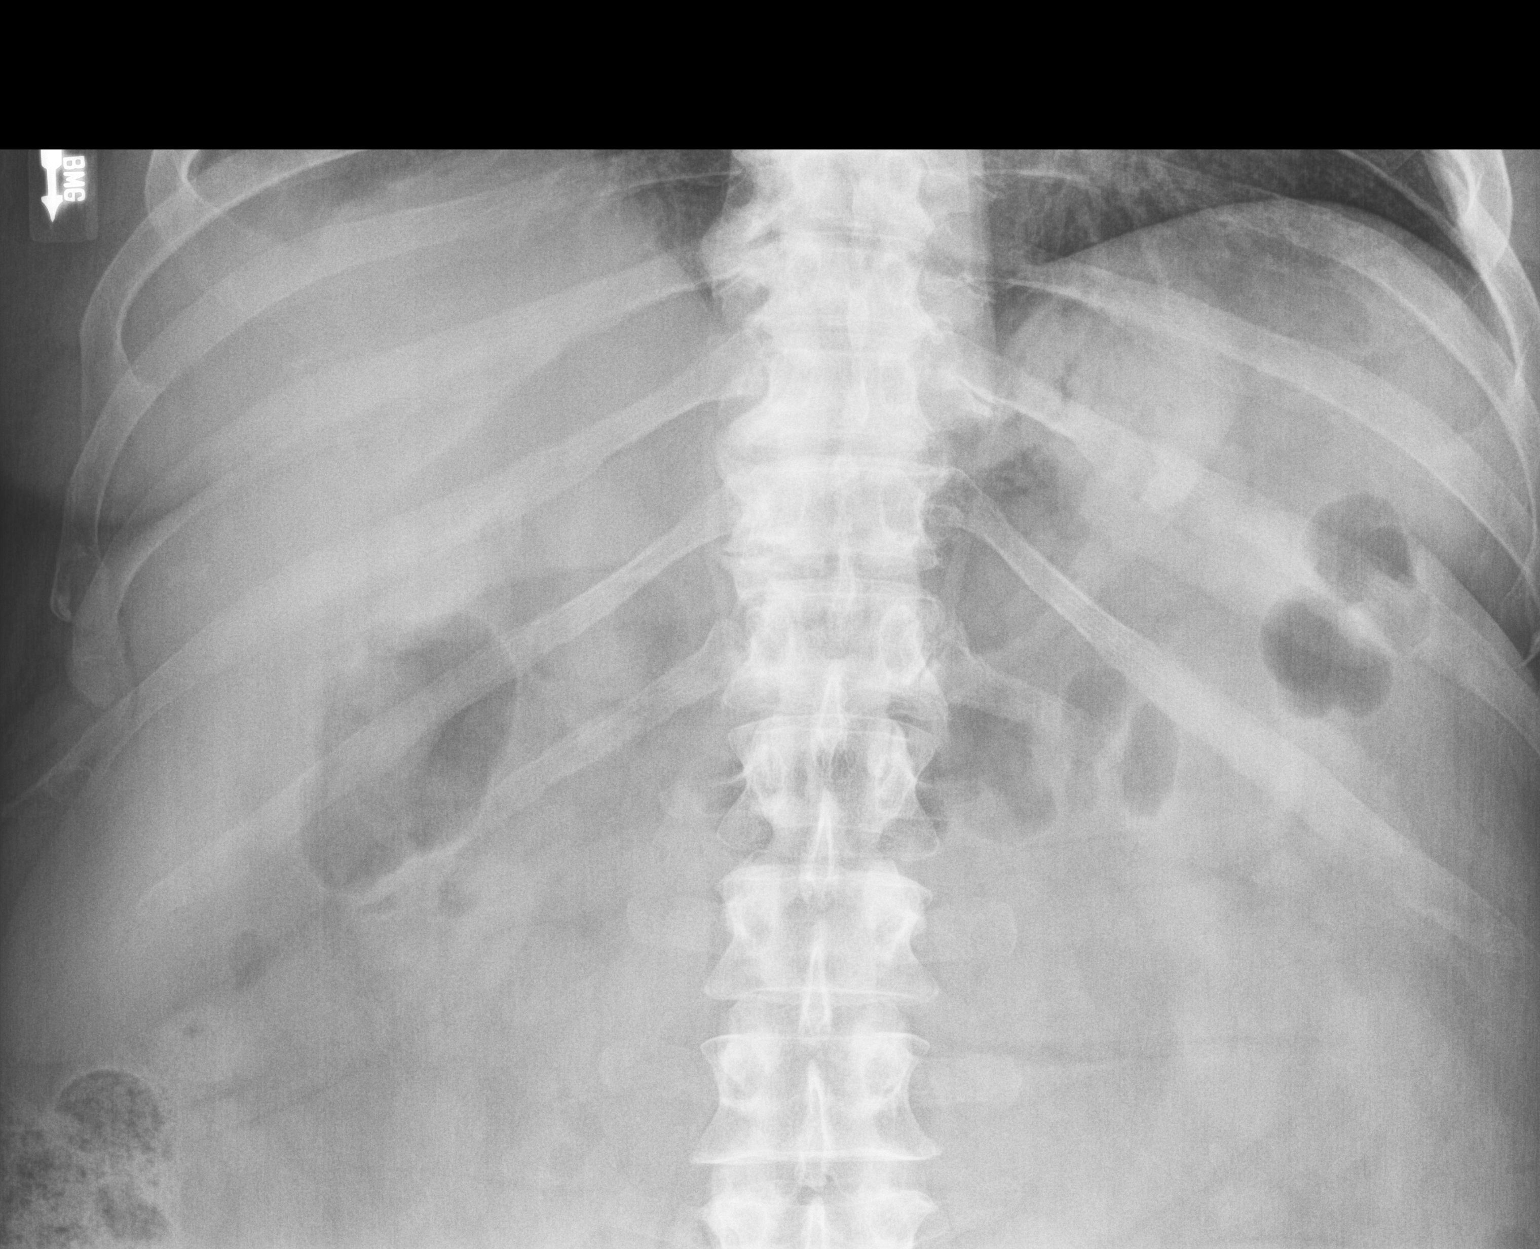

[abdomen supine (2 of 2)]
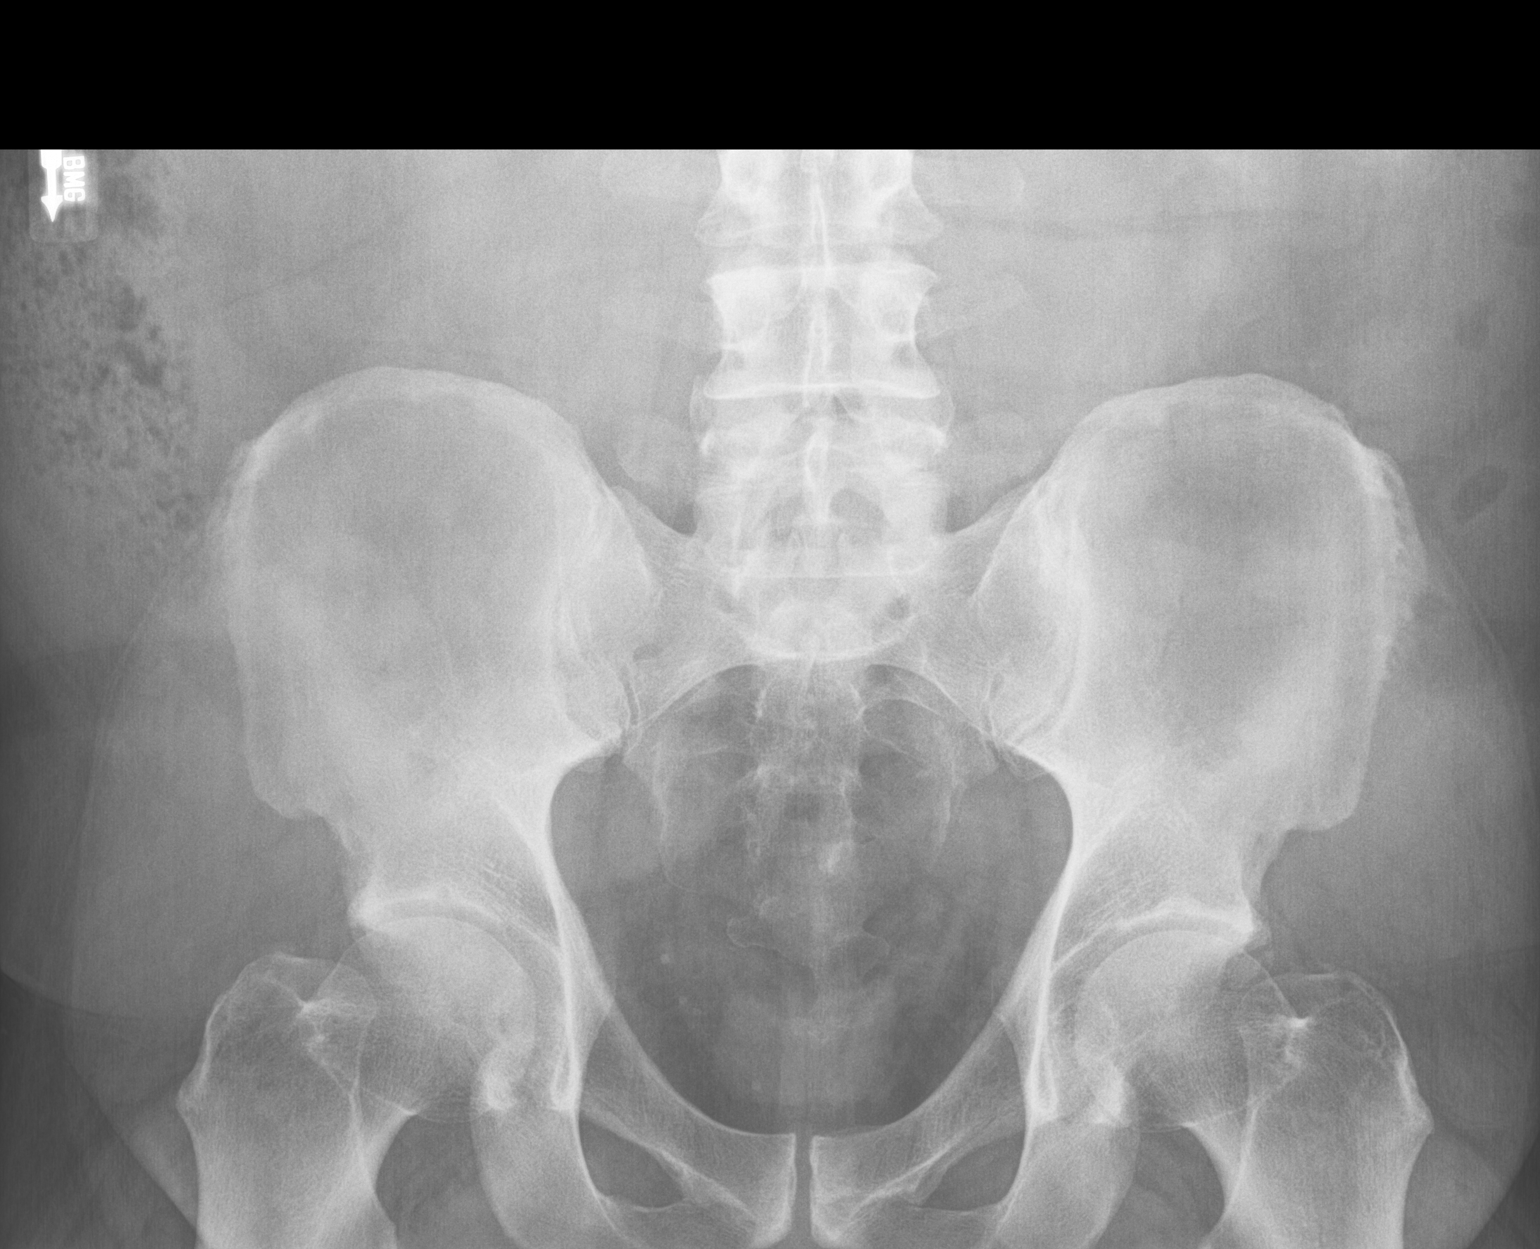

[3 of 3 positions shown; findings below may reference images not displayed]

FINDINGS: The bowel gas pattern is normal. There is no evidence of free air.
No radio-opaque calculi or other significant radiographic
abnormality is seen.
IMPRESSION: Negative.

## 2022-01-08 NOTE — Progress Notes (Unsigned)
Office Visit    Patient Name: [name] Date of Encounter: 01/09/2022  Primary Care Provider:  Janeece Agee, NP (Inactive) Primary Cardiologist:  None Primary Electrophysiologist: None  Chief Complaint    Jeffery Chapman is a 51 y.o. male with PMH of CAD s/p non degradable stent to OM, HTN, HLD, anxiety, obesity, OSA with CPAP who presents for annual follow-up of coronary artery disease.  Past Medical History    Past Medical History:  Diagnosis Date   Anxiety    CAD (coronary artery disease)    12/2011- PCI in [place]; s/p nondegradable stent to LAD; nonobstructive residual LAD disease   Depression    Encounter by telehealth for suspected COVID-19 05/25/2019   Gout    Hyperlipidemia    Hypertension    Past Surgical History:  Procedure Laterality Date   NASAL SEPTUM SURGERY  2006   TYMPANOSTOMY TUBE PLACEMENT  1976, 1981    Allergies  Allergies  Allergen Reactions   Hctz [Hydrochlorothiazide]     aggravates gout    History of Present Illness    Jeffery Chapman is a 70-year-year-old male who presents today for annual follow-up of coronary artery disease.  He underwent left heart cath in [place] for MI with biodegradable stent placed in the OM.  He was seen in 12/2011 for complaint of chest pain that was associated with chest pressure, palpitations. EKG revealed no evidence of ischemia and initial troponins WNL.  ACS was ruled out and patient was discharged in stable condition.  Jeffery Chapman was seen by Dr. Eden Emms in office on 03/2018 with complaint of left arm pain and chest pain.  He also endorsed some exertional dyspnea. He underwent  LHC  in the outpatient setting on 04/14/2012 revealing moderate nonobstructive CAD and normal LV systolic function with EF of 55%.  Patient underwent surgical clearance and Lexiscan Myoview in 2020 for surgical clearance that showed no evidence of ischemia or infarct.  He was last seen in 02/2020 for annual follow-up.  During visit  patient denied any cardiac complaints.  He had lost 70 pounds in an effort to undergo bariatric surgery.  No medication adjustments were made during visit.  Jeffery Chapman presents today for annual follow-up.  He is alone today for his appointment.  He reports that he has been feeling well and has no cardiac complaints at this time.  He has recently started Ozempic and has lost weight since his previous appointment over a year ago.  He is not however dissipating in exercise routine currently and is planning to increase activity soon with one of his good friends.  He is compliant with all medications and blood pressure is well controlled at 118/64.  Patient denies chest pain, palpitations, dyspnea, PND, orthopnea, nausea, vomiting, dizziness, syncope, edema, weight gain, or early satiety.   Home Medications    Current Outpatient Medications  Medication Sig Dispense Refill   ALPRAZolam (XANAX) 1 MG tablet TAKE 1 TABLET BY MOUTH EVERY DAY 10 tablet 0   anastrozole (ARIMIDEX) 1 MG tablet Take 1 mg by mouth daily.     aspirin 81 MG chewable tablet Chew 81 mg by mouth daily.     Barberry-Oreg Grape-Goldenseal (BERBERINE COMPLEX PO) Take 1 tablet by mouth in the morning and at bedtime.     BORON PO Take 1 tablet by mouth in the morning and at bedtime.     Cinnamon 500 MG TABS Take 1 tablet by mouth in the morning and at bedtime.  colchicine 0.6 MG tablet Take 0.6 mg by mouth as needed.     Cyanocobalamin (VITAMIN B-12 IJ) Inject 1 mL as directed every 7 (seven) days.     indomethacin (INDOCIN) 50 MG capsule Take 1 capsule (50 mg total) by mouth 2 (two) times daily as needed. TAKE 1 CAPSULE (50 MG TOTAL) BY MOUTH 2 (TWO) TIMES DAILY WITH A MEAL. 40 capsule 0   lisinopril-hydrochlorothiazide (ZESTORETIC) 20-25 MG tablet Take 1 tablet by mouth daily.     metFORMIN (GLUCOPHAGE) 500 MG tablet Take 500 mg by mouth 2 (two) times daily with a meal.     nitroGLYCERIN (NITROSTAT) 0.4 MG SL tablet PLACE 1 TABLET  (0.4 MG TOTAL) UNDER THE TONGUE EVERY 5 (FIVE) MINUTES AS NEEDED FOR CHEST PAIN. 75 tablet 1   OVER THE COUNTER MEDICATION Place 1 tablet under the tongue every 7 (seven) days. Kisspeptin     RYBELSUS 14 MG TABS Take 1 tablet by mouth daily.     Semaglutide (OZEMPIC, 0.25 OR 0.5 MG/DOSE, [place]) Inject 32 Units into the skin once a week.     tadalafil (CIALIS) 10 MG tablet Take 10 mg by mouth daily as needed for erectile dysfunction.     testosterone cypionate (DEPOTESTOSTERONE CYPIONATE) 200 MG/ML injection Inject 100 mg into the muscle every 7 (seven) days.      No current facility-administered medications for this visit.     Review of Systems  Please see the history of present illness.    (+) Chronic back pain  All other systems reviewed and are otherwise negative except as noted above.  Physical Exam    Wt Readings from Last 3 Encounters:  01/09/22 284 lb (128.8 kg)  04/18/20 291 lb 12.8 oz (132.4 kg)  03/13/20 294 lb 3.2 oz (133.4 kg)   VS: Vitals:   01/09/22 0907  BP: 118/64  Pulse: 80  SpO2: 93%  ,Body mass index is 39.61 kg/m.  Constitutional:      Appearance: Healthy appearance. Not in distress.  Neck:     Vascular: JVD normal.  Pulmonary:     Effort: Pulmonary effort is normal.     Breath sounds: No wheezing. No rales. Diminished in the bases Cardiovascular:     Normal rate. Regular rhythm. Normal S1. Normal S2.      Murmurs: There is no murmur.  Edema:    Peripheral edema absent.  Abdominal:     Palpations: Abdomen is soft non tender. There is no hepatomegaly.  Skin:    General: Skin is warm and dry.  Neurological:     General: No focal deficit present.     Mental Status: Alert and oriented to person, place and time.     Cranial Nerves: Cranial nerves are intact.  EKG/LABS/Other Studies Reviewed    ECG personally reviewed by me today -sinus rhythm with rate of 80 and no acute changes  Lab Results  Component Value Date   WBC 6.3 02/24/2020   HGB 15.2  02/24/2020   HCT 44.7 02/24/2020   MCV 90 02/24/2020   PLT 212 02/24/2020   Lab Results  Component Value Date   CREATININE 1.06 02/24/2020   BUN 11 02/24/2020   NA 141 02/24/2020   K 3.9 02/24/2020   CL 101 02/24/2020   CO2 27 02/24/2020   Lab Results  Component Value Date   ALT 36 02/24/2020   AST 25 02/24/2020   ALKPHOS 80 02/24/2020   BILITOT 0.5 02/24/2020   Lab Results  Component  Value Date   CHOL 206 (H) 06/17/2016   HDL 46 06/17/2016   LDLCALC 134 (H) 06/17/2016   TRIG 132 06/17/2016   CHOLHDL 4.5 06/17/2016    Lab Results  Component Value Date   HGBA1C 5.5 06/17/2016    Assessment & Plan    1.  Coronary artery disease: -Patient has history of CAD s/p non degradable stent to OM that was placed in [place] in 2013  2.  Essential hypertension: -Blood pressure today is well controlled at 118/64 -Patient reports compliance with current antihypertensive regimen -Continue Zestoretic 20-25 daily  3.  Hyperlipidemia: -Patient no longer taking atorvastatin since last appointment -Lipids and LFTs to evaluate LDL since 2021 and discontinuation of statin. -We will plan to restart therapy if LDLs are elevated   4.  Obstructive sleep apnea: -Patient reports inconsistent compliance with CPAP and was encouraged to increase compliance to prevent escalation of coronary artery disease  5.  Obesity: - current BMI is 39.61 kg/m -Patient is in the process of losing weight and recently was started on Ozempic for weight loss and diabetes management -He is increasing physical activity and plans to do more walking  Disposition: Follow-up with None or APP in 12 months   Medication Adjustments/Labs and Tests Ordered: Current medicines are reviewed at length with the patient today.  Concerns regarding medicines are outlined above.   Signed, Napoleon Form, Leodis Rains, NP 01/09/2022, 11:05 AM [facility]

## 2022-01-09 ENCOUNTER — Ambulatory Visit: Payer: BC Managed Care – PPO | Admitting: Nurse Practitioner

## 2022-01-09 ENCOUNTER — Telehealth: Payer: Self-pay | Admitting: *Deleted

## 2022-01-09 ENCOUNTER — Encounter: Payer: Self-pay | Admitting: Nurse Practitioner

## 2022-01-09 VITALS — BP 118/64 | HR 80 | Ht 71.0 in | Wt 284.0 lb

## 2022-01-09 DIAGNOSIS — G4730 Sleep apnea, unspecified: Secondary | ICD-10-CM

## 2022-01-09 DIAGNOSIS — I25119 Atherosclerotic heart disease of native coronary artery with unspecified angina pectoris: Secondary | ICD-10-CM | POA: Diagnosis not present

## 2022-01-09 DIAGNOSIS — E782 Mixed hyperlipidemia: Secondary | ICD-10-CM | POA: Diagnosis not present

## 2022-01-09 DIAGNOSIS — I10 Essential (primary) hypertension: Secondary | ICD-10-CM

## 2022-01-09 NOTE — Telephone Encounter (Signed)
SPOKE WITH PT RE NEEDING TO HAVE LIPID AND LIVER PER ERNEST DICK NP PER PT PCP FOLLOWS THIS AND HAS LABS DONE EVERY 3-6 MONTHS AND AT LAST CHECK TOTAL CHOLESTEROL WAS 145 ON NO MEDS PER PT DISCONTINUED MEDS DUE TO SIGNIFICANT WEIGHT LOSS .Jeffery Chapman

## 2022-01-09 NOTE — Patient Instructions (Signed)
Medication Instructions:  NO CHANGES *If you need a refill on your cardiac medications before your next appointment, please call your pharmacy*   Lab Work: NONE If you have labs (blood work) drawn today and your tests are completely normal, you will receive your results only by: MyChart Message (if you have MyChart) OR A paper copy in the mail If you have any lab test that is abnormal or we need to change your treatment, we will call you to review the results.   Testing/Procedures: NONE   Follow-Up: At [facility], you and your health needs are our priority.  As part of our continuing mission to provide you with exceptional heart care, we have created designated Provider Care Teams.  These Care Teams include your primary Cardiologist (physician) and Advanced Practice Providers (APPs -  Physician Assistants and Nurse Practitioners) who all work together to provide you with the care you need, when you need it.  We recommend signing up for the patient portal called "MyChart".  Sign up information is provided on this After Visit Summary.  MyChart is used to connect with patients for Virtual Visits (Telemedicine).  Patients are able to view lab/test results, encounter notes, upcoming appointments, etc.  Non-urgent messages can be sent to your provider as well.   To learn more about what you can do with MyChart, go to [email or web address].    Your next appointment:   1 year(s)  The format for your next appointment:   In Person  Provider:   DR Eden Emms    Other Instructions NONE  Important Information About Sugar
